# Patient Record
Sex: Female | Born: 1970 | Race: White | Hispanic: No | Smoking: Never smoker
Health system: Southern US, Community
[De-identification: ages and names within clinical notes are randomized; demographics above are authoritative.]

## PROBLEM LIST (undated history)

## (undated) DIAGNOSIS — F32A Depression, unspecified: Secondary | ICD-10-CM

## (undated) DIAGNOSIS — Z923 Personal history of irradiation: Secondary | ICD-10-CM

## (undated) DIAGNOSIS — G43909 Migraine, unspecified, not intractable, without status migrainosus: Secondary | ICD-10-CM

## (undated) DIAGNOSIS — F329 Major depressive disorder, single episode, unspecified: Secondary | ICD-10-CM

## (undated) DIAGNOSIS — C50919 Malignant neoplasm of unspecified site of unspecified female breast: Secondary | ICD-10-CM

## (undated) DIAGNOSIS — F419 Anxiety disorder, unspecified: Secondary | ICD-10-CM

## (undated) DIAGNOSIS — G2581 Restless legs syndrome: Secondary | ICD-10-CM

## (undated) DIAGNOSIS — C801 Malignant (primary) neoplasm, unspecified: Secondary | ICD-10-CM

## (undated) DIAGNOSIS — Z87442 Personal history of urinary calculi: Secondary | ICD-10-CM

## (undated) DIAGNOSIS — K219 Gastro-esophageal reflux disease without esophagitis: Secondary | ICD-10-CM

## (undated) HISTORY — PX: HEMORROIDECTOMY: SUR656

## (undated) HISTORY — PX: CHOLECYSTECTOMY: SHX55

## (undated) HISTORY — PX: ABDOMINAL HYSTERECTOMY: SHX81

## (undated) HISTORY — PX: OTHER SURGICAL HISTORY: SHX169

## (undated) HISTORY — PX: BREAST BIOPSY: SHX20

## (undated) HISTORY — PX: BREAST SURGERY: SHX581

---

## 2012-09-20 DIAGNOSIS — C50919 Malignant neoplasm of unspecified site of unspecified female breast: Secondary | ICD-10-CM

## 2012-09-20 DIAGNOSIS — Z923 Personal history of irradiation: Secondary | ICD-10-CM

## 2012-09-20 HISTORY — PX: BREAST LUMPECTOMY: SHX2

## 2012-09-20 HISTORY — DX: Personal history of irradiation: Z92.3

## 2012-09-20 HISTORY — DX: Malignant neoplasm of unspecified site of unspecified female breast: C50.919

## 2014-11-29 LAB — HM COLONOSCOPY

## 2015-10-28 DIAGNOSIS — Z86000 Personal history of in-situ neoplasm of breast: Secondary | ICD-10-CM | POA: Diagnosis not present

## 2016-09-04 DIAGNOSIS — S61211A Laceration without foreign body of left index finger without damage to nail, initial encounter: Secondary | ICD-10-CM | POA: Diagnosis not present

## 2016-09-09 DIAGNOSIS — Z4801 Encounter for change or removal of surgical wound dressing: Secondary | ICD-10-CM | POA: Diagnosis not present

## 2016-09-09 DIAGNOSIS — Z4802 Encounter for removal of sutures: Secondary | ICD-10-CM | POA: Diagnosis not present

## 2016-10-14 ENCOUNTER — Observation Stay (HOSPITAL_COMMUNITY): Payer: 59

## 2016-10-14 ENCOUNTER — Emergency Department (HOSPITAL_COMMUNITY): Payer: 59

## 2016-10-14 ENCOUNTER — Observation Stay (HOSPITAL_COMMUNITY)
Admission: EM | Admit: 2016-10-14 | Discharge: 2016-10-15 | Disposition: A | Discharge status: Home / Self Care | Payer: 59 | Attending: Internal Medicine | Admitting: Internal Medicine

## 2016-10-14 ENCOUNTER — Encounter (HOSPITAL_COMMUNITY): Payer: Self-pay | Admitting: Emergency Medicine

## 2016-10-14 DIAGNOSIS — F329 Major depressive disorder, single episode, unspecified: Secondary | ICD-10-CM | POA: Diagnosis not present

## 2016-10-14 DIAGNOSIS — G2581 Restless legs syndrome: Secondary | ICD-10-CM | POA: Diagnosis not present

## 2016-10-14 DIAGNOSIS — R42 Dizziness and giddiness: Secondary | ICD-10-CM | POA: Diagnosis not present

## 2016-10-14 DIAGNOSIS — R55 Syncope and collapse: Secondary | ICD-10-CM | POA: Diagnosis not present

## 2016-10-14 DIAGNOSIS — F419 Anxiety disorder, unspecified: Secondary | ICD-10-CM | POA: Insufficient documentation

## 2016-10-14 DIAGNOSIS — R4789 Other speech disturbances: Secondary | ICD-10-CM

## 2016-10-14 DIAGNOSIS — K219 Gastro-esophageal reflux disease without esophagitis: Secondary | ICD-10-CM

## 2016-10-14 DIAGNOSIS — Z853 Personal history of malignant neoplasm of breast: Secondary | ICD-10-CM | POA: Insufficient documentation

## 2016-10-14 DIAGNOSIS — G43909 Migraine, unspecified, not intractable, without status migrainosus: Secondary | ICD-10-CM

## 2016-10-14 DIAGNOSIS — R4701 Aphasia: Secondary | ICD-10-CM | POA: Diagnosis not present

## 2016-10-14 DIAGNOSIS — Z79899 Other long term (current) drug therapy: Secondary | ICD-10-CM | POA: Diagnosis not present

## 2016-10-14 DIAGNOSIS — D0510 Intraductal carcinoma in situ of unspecified breast: Secondary | ICD-10-CM

## 2016-10-14 DIAGNOSIS — D0511 Intraductal carcinoma in situ of right breast: Secondary | ICD-10-CM

## 2016-10-14 DIAGNOSIS — R404 Transient alteration of awareness: Secondary | ICD-10-CM | POA: Diagnosis not present

## 2016-10-14 HISTORY — DX: Malignant (primary) neoplasm, unspecified: C80.1

## 2016-10-14 HISTORY — DX: Migraine, unspecified, not intractable, without status migrainosus: G43.909

## 2016-10-14 HISTORY — DX: Gastro-esophageal reflux disease without esophagitis: K21.9

## 2016-10-14 HISTORY — DX: Depression, unspecified: F32.A

## 2016-10-14 HISTORY — DX: Major depressive disorder, single episode, unspecified: F32.9

## 2016-10-14 HISTORY — DX: Anxiety disorder, unspecified: F41.9

## 2016-10-14 LAB — DIFFERENTIAL
Basophils Absolute: 0 10*3/uL (ref 0.0–0.1)
Basophils Relative: 0 %
Eosinophils Absolute: 0.1 10*3/uL (ref 0.0–0.7)
Eosinophils Relative: 1 %
Lymphocytes Relative: 25 %
Lymphs Abs: 2.1 10*3/uL (ref 0.7–4.0)
Monocytes Absolute: 0.4 10*3/uL (ref 0.1–1.0)
Monocytes Relative: 5 %
Neutro Abs: 5.9 10*3/uL (ref 1.7–7.7)
Neutrophils Relative %: 69 %

## 2016-10-14 LAB — CBC
HCT: 35.8 % — ABNORMAL LOW (ref 36.0–46.0)
Hemoglobin: 12 g/dL (ref 12.0–15.0)
MCH: 30.2 pg (ref 26.0–34.0)
MCHC: 33.5 g/dL (ref 30.0–36.0)
MCV: 89.9 fL (ref 78.0–100.0)
Platelets: 246 10*3/uL (ref 150–400)
RBC: 3.98 MIL/uL (ref 3.87–5.11)
RDW: 13.2 % (ref 11.5–15.5)
WBC: 8.6 10*3/uL (ref 4.0–10.5)

## 2016-10-14 LAB — COMPREHENSIVE METABOLIC PANEL
ALT: 18 U/L (ref 14–54)
AST: 21 U/L (ref 15–41)
Albumin: 3.8 g/dL (ref 3.5–5.0)
Alkaline Phosphatase: 70 U/L (ref 38–126)
Anion gap: 9 (ref 5–15)
BUN: 11 mg/dL (ref 6–20)
CO2: 22 mmol/L (ref 22–32)
Calcium: 9.6 mg/dL (ref 8.9–10.3)
Chloride: 108 mmol/L (ref 101–111)
Creatinine, Ser: 0.73 mg/dL (ref 0.44–1.00)
GFR calc Af Amer: >60 mL/min (ref >60)
GFR calc non Af Amer: >60 mL/min (ref >60)
Glucose, Bld: 100 mg/dL — ABNORMAL HIGH (ref 65–99)
Potassium: 3.8 mmol/L (ref 3.5–5.1)
Sodium: 139 mmol/L (ref 135–145)
Total Bilirubin: 0.3 mg/dL (ref 0.3–1.2)
Total Protein: 7 g/dL (ref 6.5–8.1)

## 2016-10-14 LAB — RAPID URINE DRUG SCREEN, HOSP PERFORMED
AMPHETAMINES: NOT DETECTED
BENZODIAZEPINES: NOT DETECTED
Barbiturates: NOT DETECTED
Cocaine: NOT DETECTED
Opiates: NOT DETECTED
TETRAHYDROCANNABINOL: NOT DETECTED

## 2016-10-14 LAB — URINALYSIS, ROUTINE W REFLEX MICROSCOPIC
Bilirubin Urine: NEGATIVE
GLUCOSE, UA: NEGATIVE mg/dL
HGB URINE DIPSTICK: NEGATIVE
KETONES UR: NEGATIVE mg/dL
Leukocytes, UA: NEGATIVE
Nitrite: NEGATIVE
PROTEIN: NEGATIVE mg/dL
Specific Gravity, Urine: 1.005 (ref 1.005–1.030)
pH: 7 (ref 5.0–8.0)

## 2016-10-14 LAB — I-STAT TROPONIN, ED: Troponin i, poc: 0 ng/mL (ref 0.00–0.08)

## 2016-10-14 LAB — PROTIME-INR
INR: 1.01
Prothrombin Time: 13.3 seconds (ref 11.4–15.2)

## 2016-10-14 LAB — I-STAT CHEM 8, ED
BUN: 12 mg/dL (ref 6–20)
Calcium, Ion: 1.17 mmol/L (ref 1.15–1.40)
Chloride: 107 mmol/L (ref 101–111)
Creatinine, Ser: 1.5 mg/dL — ABNORMAL HIGH (ref 0.44–1.00)
Glucose, Bld: 99 mg/dL (ref 65–99)
HCT: 34 % — ABNORMAL LOW (ref 36.0–46.0)
Hemoglobin: 11.6 g/dL — ABNORMAL LOW (ref 12.0–15.0)
Potassium: 3.7 mmol/L (ref 3.5–5.1)
Sodium: 141 mmol/L (ref 135–145)
TCO2: 24 mmol/L (ref 0–100)

## 2016-10-14 LAB — CBG MONITORING, ED: Glucose-Capillary: 93 mg/dL (ref 65–99)

## 2016-10-14 LAB — APTT: aPTT: 25 seconds (ref 24–36)

## 2016-10-14 MED ORDER — SENNOSIDES-DOCUSATE SODIUM 8.6-50 MG PO TABS
1.0000 | ORAL_TABLET | Freq: Every day | ORAL | Status: DC
  Administered 2016-10-14: 1 via ORAL
  Filled 2016-10-14: qty 1

## 2016-10-14 MED ORDER — HEPARIN SODIUM (PORCINE) 5000 UNIT/ML IJ SOLN
5000.0000 [IU] | Freq: Three times a day (TID) | INTRAMUSCULAR | Status: DC
  Administered 2016-10-14 – 2016-10-15 (×3): 5000 [IU] via SUBCUTANEOUS
  Filled 2016-10-14 (×3): qty 1

## 2016-10-14 MED ORDER — PRAMIPEXOLE DIHYDROCHLORIDE 0.125 MG PO TABS
0.2500 mg | ORAL_TABLET | Freq: Two times a day (BID) | ORAL | Status: DC
  Administered 2016-10-14: 0.25 mg via ORAL
  Filled 2016-10-14: qty 2

## 2016-10-14 MED ORDER — ONDANSETRON HCL 4 MG/2ML IJ SOLN: 4.0000 mg | Freq: Four times a day (QID) | INTRAMUSCULAR | Status: DC | PRN

## 2016-10-14 MED ORDER — GABAPENTIN 300 MG PO CAPS
300.0000 mg | ORAL_CAPSULE | Freq: Two times a day (BID) | ORAL | Status: DC
  Administered 2016-10-14: 300 mg via ORAL
  Filled 2016-10-14 (×2): qty 1

## 2016-10-14 MED ORDER — ACETAMINOPHEN 325 MG PO TABS
650.0000 mg | ORAL_TABLET | Freq: Four times a day (QID) | ORAL | Status: DC | PRN
  Administered 2016-10-14 – 2016-10-15 (×3): 650 mg via ORAL
  Filled 2016-10-14 (×3): qty 2

## 2016-10-14 MED ORDER — PANTOPRAZOLE SODIUM 40 MG PO TBEC
40.0000 mg | DELAYED_RELEASE_TABLET | Freq: Every day | ORAL | Status: DC
Start: 2016-10-14 — End: 2016-10-15
  Filled 2016-10-14: qty 1

## 2016-10-14 MED ORDER — ACETAMINOPHEN 650 MG RE SUPP: 650.0000 mg | Freq: Four times a day (QID) | RECTAL | Status: DC | PRN

## 2016-10-14 MED ORDER — ONDANSETRON HCL 4 MG PO TABS: 4.0000 mg | ORAL_TABLET | Freq: Four times a day (QID) | ORAL | Status: DC | PRN

## 2016-10-14 MED ORDER — ESCITALOPRAM OXALATE 10 MG PO TABS
10.0000 mg | ORAL_TABLET | Freq: Every day | ORAL | Status: DC
  Administered 2016-10-14: 10 mg via ORAL
  Filled 2016-10-14: qty 1

## 2016-10-14 MED ORDER — TOPIRAMATE 100 MG PO TABS
200.0000 mg | ORAL_TABLET | Freq: Every day | ORAL | Status: DC
  Administered 2016-10-14: 200 mg via ORAL
  Filled 2016-10-14: qty 2

## 2016-10-14 NOTE — Progress Notes (Signed)
Requesting Physician: Dr. Wilson Singer    Chief Complaint: Code stroke  History obtained from:  EMS  HPI:                                                                                                                                         Carmen Gutierrez is an 46 y.o. female brought to [facility]'s code stroke. Per EMS patient was at work when she had a syncopal episode. After her episode she came back to baseline. 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 On arrival patient was nonverbal followed no commands however she did track people in their motions in the room. When asked questions all she did is not yes. When nodding yes she would not yes appropriately at times and other times completely inappropriately. When patient was getting undressed she started to tap her hand on the bed and become labile and cry. When asked what was wrong patient would not answer but continued to be very tearful. Patient did not show any localizing or lateralizing symptoms. She was moving all extremities spontaneously antigravity when not looking. When asked about her past medical history or medications she would not answer her diet bulging any information. Initial CT of head was negative.  Date last known well: Date: 10/14/2016 Time last known well: Time: 10:00 tPA Given: No: minimal symptoms   No past medical history on file.  No past surgical history on file.  No family history on file. Social History:  has no tobacco, alcohol, and drug history on file.  Allergies: Allergies not on file  Medications:                                                                                                                           No current facility-administered medications for this encounter.    No current outpatient prescriptions on file.     ROS:  History obtained from unobtainable from patient due to Patient is mute    Neurologic Examination:                                                                                                      There were no vitals taken for this visit.  HEENT-  Normocephalic, no lesions, without obvious abnormality.  Normal external eye and conjunctiva.  Normal TM's bilaterally.  Normal auditory canals and external ears. Normal external nose, mucus membranes and septum.  Normal pharynx. Cardiovascular- S1, S2 normal, pulses palpable throughout   Lungs- chest clear, no wheezing, rales, normal symmetric air entry Abdomen- normal findings: bowel sounds normal Extremities- no edema Lymph-no adenopathy palpable Musculoskeletal-no joint tenderness, deformity or swelling Skin-warm and dry, no hyperpigmentation, vitiligo, or suspicious lesions  Neurological Examination Mental Status: She is alert at this time she tracks my movements. Blinks to threat bilaterally. Attempts to make contact by tapping her hand on the bed with her left hand and then becomes very labile. She answers yes by nodding to all questions even if it is inappropriate Cranial Nerves: II:  Visual fields grossly normal,  III,IV, VI: ptosis not present, extra-ocular motions intact bilaterally, pupils equal, round, reactive to light and accommodation V,VII: smile symmetric, facial light touch sensation normal bilaterally VIII: hearing normal bilaterally IX,X: uvula rises symmetrically XI: bilateral shoulder shrug XII: midline tongue extension Motor: Patient is moving all extremities antigravity spontaneously. On bilateral arms are held above her head and let go she avoids allowing her hand to hit her head. When asked to lift her legs she refuses 2. When [place] temp into change or close she sits there as though she is a rag doll and makes no attempt to help the text change her Sensory: Makes no response to significant noxious  stimuli in all extremities Deep Tendon Reflexes: 2+ and symmetric throughout Plantars: Right: downgoing   Left: downgoing Cerebellar: Unable to obtain as patient would not take part in exam Gait: Not tested       Lab Results: Basic Metabolic Panel:  Recent Labs Lab 10/14/16 1112  NA 141  K 3.7  CL 107  GLUCOSE 99  BUN 12  CREATININE 1.50*    Liver Function Tests: No results for input(s): AST, ALT, ALKPHOS, BILITOT, PROT, ALBUMIN in the last 168 hours. No results for input(s): LIPASE, AMYLASE in the last 168 hours. No results for input(s): AMMONIA in the last 168 hours.  CBC:  Recent Labs Lab 10/14/16 1100 10/14/16 1112  WBC 8.6  --   NEUTROABS 5.9  --   HGB 12.0 11.6*  HCT 35.8* 34.0*  MCV 89.9  --   PLT 246  --     Cardiac Enzymes: No results for input(s): CKTOTAL, CKMB, CKMBINDEX, TROPONINI in the last 168 hours.  Lipid Panel: No results for input(s): CHOL, TRIG, HDL, CHOLHDL, VLDL, LDLCALC in the last 168 hours.  CBG: No results for input(s): GLUCAP in the last 168 hours.  Microbiology: No results found for this or any previous visit.  Coagulation Studies: No results for input(s):  LABPROT, INR in the last 72 hours.  Imaging: Ct Head Code Stroke W/o Cm  Result Date: 10/14/2016 CLINICAL DATA:  Code stroke. 46 year old female with severe headache, became unresponsive. Initial encounter. EXAM: CT HEAD WITHOUT CONTRAST TECHNIQUE: Contiguous axial images were obtained from the base of the skull through the vertex without intravenous contrast. COMPARISON:  Head CT without contrast 06/22/2016 and earlier. FINDINGS: Brain: Cerebral volume is normal. No midline shift, ventriculomegaly, mass effect, evidence of mass lesion, intracranial hemorrhage or evidence of cortically based acute infarction. Gray-white matter differentiation is within normal limits throughout the brain. Vascular: No suspicious intracranial vascular hyperdensity. 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 Sinuses/Orbits: Visualized paranasal sinuses and mastoids are stable and well pneumatized. Other: Negative orbit and scalp soft tissues. ASPECTS ([place] Stroke Program Early CT Score) - Ganglionic level infarction (caudate, lentiform nuclei, internal capsule, insula, M1-M3 cortex): 7 - Supraganglionic infarction (M4-M6 cortex): 3 Total score (0-10 with 10 being normal): 10 IMPRESSION: 1. Stable and normal noncontrast CT appearance of the brain. 2. ASPECTS is 10. 3. The above was relayed via text pager to Dr. Milus Banister on 10/14/2016 at 11:20 . Electronically Signed   By: [name] M.D.   On: 10/14/2016 11:20       Assessment and plan discussed with with attending physician and they are in agreement.    Etta Quill PA-C [facility] [phone]  10/14/2016, 11:28 AM   Assessment: 46 y.o. female with no known past medical history at this time. 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 Currently patient is mute but it tends to make communication through tapping the bed and she is labile and crying. There is no localizing or lateralizing symptoms other than her muteness. CT was negative. Given the fact that she had a syncopal episode and we do not know her past medical history he would be wise to obtain MRI brain in addition EEG to rule out any organic issues. This very well could be nonorganic and psychogenic.  Recommend: MRI brain without contrast EEG  Stroke Risk Factors - none

## 2016-10-14 NOTE — Progress Notes (Signed)
EEG Completed; Results Pending  

## 2016-10-14 NOTE — ED Notes (Signed)
Report given to [facility], RN on [facility]

## 2016-10-14 NOTE — Procedures (Signed)
Electroencephalogram (EEG) Report  Date of study: 10/14/16  Requesting clinician: Etta Quill, PA  Reason for study: Evaluate for possible seizure  Brief clinical history: This is a 46 year old woman who reportedly had 2 episodes where she lost consciousness. After the second episode, she has remained nonverbal. EEG is not being performed for further evaluation.  Medications: None reported  Description: This is a routine EEG performed using standard international 10-20 electrode placement. A total of 18 channels are recorded, including one for the EKG. Wakefulness, drowsiness, and stage NI sleep are recorded.   Activating Maneuvers: None  Findings:  The EKG channel demonstrates a regular rhythm with a rate of 90-100 beats per minute.   The background consists of well-formed alpha activity with average frequency of about 9-11 Hz. The best dominant posterior rhythm is 11 Hz. This is symmetric and reacts as expected with eye opening.   There are no focal asymmetries. No epileptiform discharges are present. No seizures are recorded.   Drowsiness is recorded and is normal in appearance. Stage NI sleep appears normal.  Impression:  This is a normal awake and drowsy EEG.  Clinical correlation: This EEG does not show any evidence of seizure or other pathology that would explain the patient's clinical presentation.   Melba Coon, MD [facility]

## 2016-10-14 NOTE — ED Notes (Signed)
Returned from [employer]-- family at bedside.

## 2016-10-14 NOTE — ED Triage Notes (Signed)
Pt to ED via [facility] from work-- with pt initially c/o severe headache with vomiting, pt requested to go to [facility] , enroute pt became aphasic, and would not follow commands, pt then became a code stroke.  On arrival to this ED -- pt was alert, nonverbal, would follow Dr. Brock Bad commands after CT-- [facility] to follow.  Pt was vomiting enroute-- received Zofran 4 mg IV-- 20g in left ac

## 2016-10-14 NOTE — ED Provider Notes (Signed)
[facility] DEPT Provider Note   CSN: [id number] Arrival date & time: 10/14/16  1103   By signing my name below, I, Carmen Gutierrez, attest that this documentation has been prepared under the direction and in the presence of [name], MD . Electronically Signed: [name], Scribe. 10/14/2016. 11:58 AM.  History   Chief Complaint Chief Complaint  Patient presents with  . Code Stroke    LEVEL 5 CAVEAT DUE TO AMS  History provided by: ED RN. No language interpreter was used.     HPI Comments:  Carmen Gutierrez is a 46 y.o. female who presents to the Emergency Department via EMS. Per Triage note, pt was originally on her way to [facility] with complaint of HA. En route she became aphasic and was instead brought to [facility] as a code stroke. At this time pt non-verbal and history/ROS is unattainable due to her current mental status.    No past medical history on file.  There are no active problems to display for this patient.   No past surgical history on file.  OB History    No data available       Home Medications    Prior to Admission medications   Not on File    Family History No family history on file.  Social History Social History  Substance Use Topics  . Smoking status: Not on file  . Smokeless tobacco: Not on file  . Alcohol use Not on file     Allergies   Patient has no allergy information on record.   Review of Systems Review of Systems  Unable to perform ROS: Mental status change     Physical Exam Updated Vital Signs BP 124/90 (BP Location: Left Arm)   Pulse 82   Temp 98.6 F (37 C) (Oral)   Resp 14   Wt 225 lb 8.5 oz (102.3 kg)   SpO2 100%   Physical Exam  Constitutional: She appears well-developed and well-nourished.  HENT:  Head: Normocephalic and atraumatic.  Eyes: EOM are normal.  Neck: Normal range of motion.  Cardiovascular: Normal rate, regular rhythm and normal heart sounds.   Pulmonary/Chest:  Effort normal and breath sounds normal.  Abdominal: Soft. She exhibits no distension. There is no tenderness.  Musculoskeletal: Normal range of motion.  Neurological: She is alert.  Laying in bed with eyes open; tearful  She'll nod head to questioning but not following commands  No facial droop  Muscle tone is nml  Moved LUE spontaneously   Skin: Skin is warm and dry.  Nursing note and vitals reviewed.    ED Treatments / Results  DIAGNOSTIC STUDIES:  Oxygen Saturation is 99% on RA, normal by my interpretation.    Labs (all labs ordered are listed, but only abnormal results are displayed) Labs Reviewed  CBC - Abnormal; Notable for the following:       Result Value   HCT 35.8 (*)    All other components within normal limits  COMPREHENSIVE METABOLIC PANEL - Abnormal; Notable for the following:    Glucose, Bld 100 (*)    All other components within normal limits  I-STAT CHEM 8, ED - Abnormal; Notable for the following:    Creatinine, Ser 1.50 (*)    Hemoglobin 11.6 (*)    HCT 34.0 (*)    All other components within normal limits  PROTIME-INR  APTT  DIFFERENTIAL  I-STAT TROPOININ, ED  CBG MONITORING, ED    EKG  EKG Interpretation  Date/Time:  Thursday October 14 2016 11:24:09 EST Ventricular Rate:  88 PR Interval:    QRS Duration: 86 QT Interval:  366 QTC Calculation: 443 R Axis:   33 Text Interpretation:  Sinus rhythm No old tracing to compare Confirmed by [name]  MD, [name] [phone]) on 10/14/2016 11:58:14 AM       Radiology Ct Head Code Stroke W/o Cm  Result Date: 10/14/2016 CLINICAL DATA:  Code stroke. 46 year old female with severe headache, became unresponsive. Initial encounter. EXAM: CT HEAD WITHOUT CONTRAST TECHNIQUE: Contiguous axial images were obtained from the base of the skull through the vertex without intravenous contrast. COMPARISON:  Head CT without contrast 06/22/2016 and earlier. FINDINGS: Brain: Cerebral volume is normal. No midline shift,  ventriculomegaly, mass effect, evidence of mass lesion, intracranial hemorrhage or evidence of cortically based acute infarction. Gray-white matter differentiation is within normal limits throughout the brain. Vascular: No suspicious intracranial vascular hyperdensity. 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 Sinuses/Orbits: Visualized paranasal sinuses and mastoids are stable and well pneumatized. Other: Negative orbit and scalp soft tissues. ASPECTS ([place] Stroke Program Early CT Score) - Ganglionic level infarction (caudate, lentiform nuclei, internal capsule, insula, M1-M3 cortex): 7 - Supraganglionic infarction (M4-M6 cortex): 3 Total score (0-10 with 10 being normal): 10 IMPRESSION: 1. Stable and normal noncontrast CT appearance of the brain. 2. ASPECTS is 10. 3. The above was relayed via text pager to Dr. Milus Banister on 10/14/2016 at 11:20 . Electronically Signed   By: [name] M.D.   On: 10/14/2016 11:20    Procedures Procedures (including critical care time)  Medications Ordered in ED Medications - No data to display   Initial Impression / Assessment and Plan / ED Course  I have reviewed the triage vital signs and the nursing notes.  Pertinent labs & imaging results that were available during my care of the patient were reviewed by me and considered in my medical decision making (see chart for details).     46 year old female with a aphasia. She was made a CODE STROKE. She was evaluated by the neurology team. CT the head without any acute abnormality. On my exam, she is nonverbal. She is tearful and seems somewhat frustrated. She'll not follow commands but she will move her extremities.   She apparently was complaining of a headache earlier. She is normotensive. Past medical history list migraines. Her neuro exam has not significantly changed since beingin  the emergency room. Suspect there may be a psychogenic component. Neurology  recommending MRI and EEG. Will discuss with medicine for admission.  Final Clinical Impressions(s) / ED Diagnoses   Final diagnoses:  Aphasia    New Prescriptions New Prescriptions   No medications on file    I personally preformed the services scribed in my presence. The recorded information has been reviewed is accurate. [name], MD.     [name], MD 10/27/16 1225

## 2016-10-14 NOTE — H&P (Signed)
Date: 10/14/2016               Patient Name:  Carmen Gutierrez MRN: [id number]  DOB: 07-Apr-1971 Age / Sex: 46 y.o., female   PCP: No primary care provider on file.         Medical Service: Internal Medicine Teaching Service         Attending Physician: Dr. Sid Falcon, MD    First Contact: Dr. Danford Bad Pager: [phone]  Second Contact: Dr. Tiburcio Pea Pager: [phone]       After Hours (After 5p/  First Contact Pager: [phone]  weekends / holidays): Second Contact Pager: [phone]   Chief Complaint: syncope, aphasia  History of Present Illness:   This is a 46 year old female with Mhx significant for migraines, depression, anxiety, RLS, hx of breast cancer in remission who presents for evaluation of a syncopal episode. 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 She stood up to start walking to the bathroom and felt faint. Unsure of if she lost consciousness but she knew she hit her head during the fall. Coworkers called EMS and per EMS she became aphasic during the ambulance ride. During her time in the ED she remained nonverbal and followed no commands. At the time of my evaluation, the patient had returned to her usual state of health. She reports she's had episodes like this at least 10 times over the past few years. Her sister-in-law reports after these episodes she is usually very disoriented and does not speak until she regains orientation. Denies any prodromal symptoms before the episodes other than feeling dizzy and reports they can occur even when she's seated. She denies any recent illness, headache, dizziness, numbness, tingling, weakness, confusion, fevers, chills, dysuria, abdominal pain, or any other symptoms.  In the ED VSS (98.6*, pulse 82, BP 124/90, respirations 14 and was saturating 100% on RA). CMET wnl however f/u BMET 10 mins later shows Cr of 1.5. CBC wnl. STAT CT head negative. She was  seen by neurology who suggested MRI brain + EEG and who suggested this could be nonorganic and psychogenic.   Meds:  Topamax 200mg  QHS Mirapex 0.25 mg BID Nexium 40 mg QAM Lexapro 10 mg QHS Gabapentin 300 mg BID  Allergies: Allergies as of 10/14/2016 - Review Complete 10/14/2016  Allergen Reaction Noted  . Contrast media [iodinated diagnostic agents] Other (See Comments) 10/14/2016  . Dilaudid [hydromorphone hcl] Hives 10/14/2016  . Phenergan [promethazine hcl] Hives 10/14/2016  . Shellfish allergy  10/14/2016   Past Medical History:  Diagnosis Date  . Anxiety   . Cancer ([place])   . Depression   . GERD (gastroesophageal reflux disease)   . Migraines    Social History: Has never smoked. Rarely drinks alcohol and denied any recreational drug use. Appears to have a supportive husband and sister in law who is a Marine scientist.   Review of Systems: A complete ROS was negative except as per HPI.   Physical Exam: Blood pressure 124/90, pulse 82, temperature 98.6 F (37 C), temperature source Oral, resp. rate 14, weight 225 lb 8.5 oz (102.3 kg), SpO2 100 %. General: In no acute distress. Resting comfortably in bed. Family present at bedside.  HENT: PERRL. EOMI. No conjunctival injection, icterus or ptosis. Oropharynx clear, mucous membranes moist.  Cardiovascular: Regular rate and rhythm. No murmur or rub appreciated. Pulmonary: CTA BL, no wheezing, crackles or rhonchi  appreciated. Unlabored breathing.  Abdomen: Soft, non-tender and non-distended. No guarding or rigidity. +bowel sounds.  Extremities: No peripheral edema noted BL. Intact distal pulses. No gross deformities. Skin: Warm, dry. No cyanosis.  Neuro: Strength 5/5 throughout. Sensation intact and symmetric throughout. CNII-XII grossly intact. Normal finger-to-nose testing.  Psych: Stoic. Mood normal and affect was mood congruent. Responds to questions appropriately.   EKG: NSR with rate of 88. No QT prolongation or PR prolongation.  No prior EKG for comparison however no TWI.  CT Head without Contrast 10/14/16: Normal noncontrast CT.  MRI brain: Normal  EEG 10/14/16: Normal  UDS: Negative UA: Without infection  CMP Latest Ref Rng & Units 10/14/2016 10/14/2016  Glucose 65 - 99 mg/dL 99 100(H)  BUN 6 - 20 mg/dL 12 11  Creatinine 0.44 - 1.00 mg/dL 1.50(H) 0.73  Sodium 135 - 145 mmol/L 141 139  Potassium 3.5 - 5.1 mmol/L 3.7 3.8  Chloride 101 - 111 mmol/L 107 108  CO2 22 - 32 mmol/L - 22  Calcium 8.9 - 10.3 mg/dL - 9.6  Total Protein 6.5 - 8.1 g/dL - 7.0  Total Bilirubin 0.3 - 1.2 mg/dL - 0.3  Alkaline Phos 38 - 126 U/L - 70  AST 15 - 41 U/L - 21  ALT 14 - 54 U/L - 18   CBC    Component Value Date/Time   WBC 8.6 10/14/2016 1100   RBC 3.98 10/14/2016 1100   HGB 11.6 (L) 10/14/2016 1112   HCT 34.0 (L) 10/14/2016 1112   PLT 246 10/14/2016 1100   MCV 89.9 10/14/2016 1100   MCH 30.2 10/14/2016 1100   MCHC 33.5 10/14/2016 1100   RDW 13.2 10/14/2016 1100   LYMPHSABS 2.1 10/14/2016 1100   MONOABS 0.4 10/14/2016 1100   EOSABS 0.1 10/14/2016 1100   BASOSABS 0.0 10/14/2016 1100   Assessment & Plan by Problem: Principal Problem:   Syncope Active Problems:   Aphasia   Migraines   GERD (gastroesophageal reflux disease)   Restless leg syndrome  Syncopal Episode: Pt endorses a several year history of intermittent syncopal episodes. No clear trigger per patient however reports each time her vision gets blurry and she feels lightheaded. Sister in law reports she is quite confused afterwards and often does not speak until reoriented. Denies any seizure like activity or history of seizure disorders. Neurology on and we greatly appreciate their assistance in management of this patient.  -CT head, MRI brain, EEG all normal -UDS negative -Orthostats negative -EKG without suspicious changes -Repeat EKG in AM -HbA1c and Lipid panel -Seizure disorder? Pt seems post-ictal after these events -Psychogenic syncope? -Could  consider additional w/u with Carotid US, Echo however syx have resolved entirely and imaging without ischemia   Migraines: Has been approximately 2 months since last migraine. Migraines not similar to current presentation.  -Continued Topamax 200mg  QPM  Restless Leg Syndrome: Continued Mirapex 0.25 mg BID and Gabapentin 300 mg BID  Depression, Anxiety: Continued Lexapro 10 mg  Diet: Regular IVF: nsl Code: FULL DVT Prophylaxis: Heparin Consults: Neurology  Dispo: Admit patient to Observation with expected length of stay less than 2 midnights.  SignedEinar Gip, DO 10/14/2016, 2:24 PM  Pager: [phone]

## 2016-10-14 NOTE — ED Notes (Signed)
Pt to EEG via stretcher-- remains nonverbal.

## 2016-10-14 NOTE — ED Notes (Signed)
Pt's CBG result was 93. Informed Santiago Glad - RN.

## 2016-10-15 ENCOUNTER — Observation Stay (HOSPITAL_BASED_OUTPATIENT_CLINIC_OR_DEPARTMENT_OTHER): Payer: 59

## 2016-10-15 ENCOUNTER — Encounter (HOSPITAL_COMMUNITY): Payer: Self-pay | Admitting: *Deleted

## 2016-10-15 DIAGNOSIS — Z79899 Other long term (current) drug therapy: Secondary | ICD-10-CM | POA: Diagnosis not present

## 2016-10-15 DIAGNOSIS — G2581 Restless legs syndrome: Secondary | ICD-10-CM | POA: Diagnosis not present

## 2016-10-15 DIAGNOSIS — Z91013 Allergy to seafood: Secondary | ICD-10-CM

## 2016-10-15 DIAGNOSIS — Z91041 Radiographic dye allergy status: Secondary | ICD-10-CM

## 2016-10-15 DIAGNOSIS — Z888 Allergy status to other drugs, medicaments and biological substances status: Secondary | ICD-10-CM | POA: Diagnosis not present

## 2016-10-15 DIAGNOSIS — Z853 Personal history of malignant neoplasm of breast: Secondary | ICD-10-CM | POA: Diagnosis not present

## 2016-10-15 DIAGNOSIS — R55 Syncope and collapse: Secondary | ICD-10-CM

## 2016-10-15 DIAGNOSIS — F419 Anxiety disorder, unspecified: Secondary | ICD-10-CM | POA: Diagnosis not present

## 2016-10-15 DIAGNOSIS — F329 Major depressive disorder, single episode, unspecified: Secondary | ICD-10-CM | POA: Diagnosis not present

## 2016-10-15 DIAGNOSIS — K219 Gastro-esophageal reflux disease without esophagitis: Secondary | ICD-10-CM | POA: Diagnosis not present

## 2016-10-15 DIAGNOSIS — Z885 Allergy status to narcotic agent status: Secondary | ICD-10-CM | POA: Diagnosis not present

## 2016-10-15 DIAGNOSIS — R4701 Aphasia: Secondary | ICD-10-CM | POA: Diagnosis not present

## 2016-10-15 DIAGNOSIS — G43909 Migraine, unspecified, not intractable, without status migrainosus: Secondary | ICD-10-CM | POA: Diagnosis not present

## 2016-10-15 LAB — COMPREHENSIVE METABOLIC PANEL
ALT: 17 U/L (ref 14–54)
ANION GAP: 7 (ref 5–15)
AST: 21 U/L (ref 15–41)
Albumin: 3.4 g/dL — ABNORMAL LOW (ref 3.5–5.0)
Alkaline Phosphatase: 65 U/L (ref 38–126)
BUN: 10 mg/dL (ref 6–20)
CHLORIDE: 108 mmol/L (ref 101–111)
CO2: 23 mmol/L (ref 22–32)
Calcium: 9 mg/dL (ref 8.9–10.3)
Creatinine, Ser: 0.79 mg/dL (ref 0.44–1.00)
GFR calc non Af Amer: >60 mL/min (ref >60)
Glucose, Bld: 97 mg/dL (ref 65–99)
POTASSIUM: 3.7 mmol/L (ref 3.5–5.1)
SODIUM: 138 mmol/L (ref 135–145)
Total Bilirubin: 0.6 mg/dL (ref 0.3–1.2)
Total Protein: 6.3 g/dL — ABNORMAL LOW (ref 6.5–8.1)

## 2016-10-15 LAB — CBC
HCT: 33.3 % — ABNORMAL LOW (ref 36.0–46.0)
HEMOGLOBIN: 11.1 g/dL — AB (ref 12.0–15.0)
MCH: 30.2 pg (ref 26.0–34.0)
MCHC: 33.3 g/dL (ref 30.0–36.0)
MCV: 90.5 fL (ref 78.0–100.0)
Platelets: 234 10*3/uL (ref 150–400)
RBC: 3.68 MIL/uL — AB (ref 3.87–5.11)
RDW: 13.1 % (ref 11.5–15.5)
WBC: 5.9 10*3/uL (ref 4.0–10.5)

## 2016-10-15 LAB — ECHOCARDIOGRAM COMPLETE
HEIGHTINCHES: 64 in
Weight: 3436.8 oz

## 2016-10-15 LAB — GLUCOSE, CAPILLARY: GLUCOSE-CAPILLARY: 95 mg/dL (ref 65–99)

## 2016-10-15 LAB — LIPID PANEL
CHOL/HDL RATIO: 3.5 ratio
Cholesterol: 158 mg/dL (ref 0–200)
HDL: 45 mg/dL (ref >40)
LDL CALC: 93 mg/dL (ref 0–99)
TRIGLYCERIDES: 98 mg/dL (ref <150)
VLDL: 20 mg/dL (ref 0–40)

## 2016-10-15 LAB — VITAMIN B12: Vitamin B-12: 548 pg/mL (ref 180–914)

## 2016-10-15 MED ORDER — KETOROLAC TROMETHAMINE 30 MG/ML IJ SOLN
30.0000 mg | Freq: Once | INTRAMUSCULAR | Status: AC
  Administered 2016-10-15: 30 mg via INTRAVENOUS
  Filled 2016-10-15: qty 1

## 2016-10-15 NOTE — Discharge Instructions (Signed)
You were hospitalized for evaluation of syncope. Your work-up did not reveal any cardiac, neurological or metabolic cause for your syncope. Unfortunately a clear cause is determined in only 50% of syncope cases. Please resume activity slowly and when you start to feel lightheaded or dizzy, please sit or remain seated for 3-5 minutes until it passes. Please follow-up with your primary care physician.  CT head: Normal MRI brain: Normal ECHO: Normal CXR: Without acute process CBC and CMET: within normal limits EKG: No evidence for ischemia EEG: No seizure like activity

## 2016-10-15 NOTE — Progress Notes (Signed)
   Subjective: Carmen Gutierrez was seen and evaluated today at beside. No further syncopal episodes since admission. Complains of headache improved with tylenol and toradol.   Objective:  Vital signs in last 24 hours: Vitals:   10/15/16 0539 10/15/16 0541 10/15/16 0543 10/15/16 1034  BP: 111/70 112/67 112/71 104/61  Pulse: 63 70 (!) 58 60  Resp:    19  Temp:    97.9 F (36.6 C)  TempSrc:    Oral  SpO2:    100%  Weight:      Height:       General: Resting in bed sleeping. In no acute distress.  HENT: PERRL. EOMI. No conjunctival injection, icterus or ptosis.  Cardiovascular: Regular rate and rhythm. No murmur or rub appreciated. Pulmonary: CTA BL, no wheezing, crackles or rhonchi appreciated. Unlabored breathing.  Abdomen: Soft, non-tender and non-distended. No guarding or rigidity. +bowel sounds.  Extremities: No peripheral edema noted BL. Intact distal pulses.  Skin: Warm, dry. No cyanosis.  Neuro: Strength and sensation grossly intact.  Psych: Flat affect. Responds to questions appropriately.   Assessment/Plan:  Principal Problem:   Syncope Active Problems:   Aphasia   Migraines   GERD (gastroesophageal reflux disease)   Restless leg syndrome  Syncope: So far workup (CT head, MRI brain, EEG, ECHO, EKG, trops, CMET, CBC, UA) without cause of patients recurrent syncopal episodes. Events sound consistent with vasovagal syncope.  -Neurology signed off -Likely vasovagal - will be discharged home with instructions for lifestyle modifications.   Migraines: HA today however not migrainous. Appears to be rebound headache from at home PRN analgesics.  -Tylenol -Toradol -Topamax  Dispo: Anticipated discharge today.   Mika Anastasi, DO 10/15/2016, 1:55 PM Pager: [phone]

## 2016-10-15 NOTE — Care Management Note (Signed)
Case Management Note  Patient Details  Name: Carmen Gutierrez MRN: [id number] Date of Birth: October 12, 1970  Subjective/Objective:                    Action/Plan: Pt discharging home with self care. Pt with no PCP listed. Pt states she sees Dr. Joaquin Bend in [place], but she is interested in changing to a [place] MD d/t convenience with her job. CM provided her the [facility] number to assist her in finding a new PCP. Pt state she has transportation home.   Expected Discharge Date:  10/15/16               Expected Discharge Plan:  Home/Self Care  In-House Referral:     Discharge planning Services  CM Consult  Post Acute Care Choice:    Choice offered to:     DME Arranged:    DME Agency:     HH Arranged:    HH Agency:     Status of Service:  Completed, signed off  If discussed at [employer] of Stay Meetings, dates discussed:    Additional Comments:  Pollie Friar, RN 10/15/2016, 1:25 PM

## 2016-10-15 NOTE — Progress Notes (Signed)
  Echocardiogram 2D Echocardiogram has been performed.  Asa Baudoin L Androw 10/15/2016, 10:38 AM

## 2016-10-15 NOTE — Progress Notes (Signed)
Patient is discharged from room 5C08 at 1500. Alert and in stable condition. IV site d/c'd as well as tele. Instructions read to patient with understanding verbalized. Left unit via wheelchair with mom and all belongings at side.

## 2016-10-15 NOTE — Discharge Summary (Signed)
Patient discharged to home. Alert and oriented with no new concerns. Discharge instruction and education provided, patient verbalize understanding. IV D/C earlier prior to discharge. writer advised Patient to leave floor in Wheelchair but patient insist she is doing fine and is able to walk accompanied by a family member.

## 2016-10-15 NOTE — Discharge Summary (Signed)
Name: Carmen Gutierrez MRN: [id number] DOB: 1971-07-18 46 y.o. PCP: No primary care provider on file.  Date of Admission: 10/14/2016 11:10 AM Date of Discharge: 10/15/2016 Attending Physician: Sid Falcon, MD  Discharge Diagnosis: 1. Syncope  Principal Problem:   Syncope Active Problems:   Aphasia   Migraines   GERD (gastroesophageal reflux disease)   Restless leg syndrome   Discharge Medications: Allergies as of 10/15/2016      Reactions   Contrast Media [iodinated Diagnostic Agents] Other (See Comments)   Chest pain   Dilaudid [hydromorphone Hcl] Hives   Phenergan [promethazine Hcl] Hives   Shellfish Allergy       Medication List    TAKE these medications   escitalopram 10 MG tablet Commonly known as:  LEXAPRO Take 10 mg by mouth daily.   gabapentin 300 MG capsule Commonly known as:  NEURONTIN Take 300 mg by mouth 2 (two) times daily.   NEXIUM 40 MG packet Generic drug:  esomeprazole Take 40 mg by mouth daily before breakfast.   pramipexole 0.25 MG tablet Commonly known as:  MIRAPEX Take 0.25 mg by mouth 2 (two) times daily.   rizatriptan 10 MG tablet Commonly known as:  MAXALT Take 10 mg by mouth as needed for migraine. May repeat in 2 hours if needed   topiramate 200 MG tablet Commonly known as:  TOPAMAX Take 200 mg by mouth daily.      Disposition and follow-up:   CarmenBerdene Barrett Evette Gutierrez was discharged from [facility] in stable condition.  At the hospital follow up visit please address:  1.  Syncope: Any more syncopal events? Has she been using the lifestyle modifications discussed? Increased fluid and salt intake?  2.  Labs / imaging needed at time of follow-up: None. Could consider tilt-table test if persistent.   3.  Pending labs/ test needing follow-up: None.  Hospital Course by problem list: Principal Problem:   Syncope Active Problems:   Aphasia   Migraines   GERD (gastroesophageal reflux  disease)   Restless leg syndrome   1. Syncope Carmen Gutierrez is a 46yo woman with PMH of migraines, depression, anxiety and RLS and h/o breast cancer who presented with a syncopal episode.  While working, she stood up and felt lightheaded. She walked to the bathroom and fainted and fell. This was witnessed and no seizure like activity or bowel/bladder incontinence was noted. She then became aphasic and disoriented during ambulance ride. This slowly improved after reaching the hospital and had returned to baseline approximately 3 hours later. She has episodes like this about 10X in the past few years, each time followed by being very confused and not speaking. Neurology was consulted who ordered EEG and MRI brain which were normal. They did not feel like there was a neurologic etiology and had signed off. Cardiac workup, including telemetry, troponins, EKG and ECHO, were normal. Orthostatic vitals were normal as well. She was subsequently discharged home with diagnosis of vasovagal syncope and instructions for lifestyle modifications.   Discharge Vitals:   BP 104/61 (BP Location: Left Arm)   Pulse 60   Temp 97.9 F (36.6 C) (Oral)   Resp 19   Ht 5\' 4"  (1.626 m)   Wt 214 lb 12.8 oz (97.4 kg)   SpO2 100%   BMI 36.87 kg/m   Pertinent Labs, Studies, and Procedures:  MRI  Brain: normal EEG: normal ECHO: normal Troponins: Normal EKG: Without changes consistent with ACS CMET and CBC: unrevealing  Discharge  Instructions: Please follow up with your primary care physician after discharge to discuss this admission. Please take all medications as instructed and comply with lifestyle modifications discussed at discharge.   SignedEinar Gip, DO 10/15/2016, 4:07 PM   Pager: [phone]

## 2016-10-15 NOTE — Care Management Note (Signed)
Case Management Note  Patient Details  Name: Carmen Gutierrez MRN: [id number] Date of Birth: 1971-03-02  Subjective/Objective:           Patient presented with syncope and aphasia. Lives at home with spouse. CM will follow for discharge needs pending patient's progress and physician orders.          Action/Plan:   Expected Discharge Date:   (Pending)               Expected Discharge Plan:     In-House Referral:     Discharge planning Services     Post Acute Care Choice:    Choice offered to:     DME Arranged:    DME Agency:     HH Arranged:    HH Agency:     Status of Service:     If discussed at [employer] of Stay Meetings, dates discussed:    Additional Comments:  Rolm Baptise, RN 10/15/2016, 11:17 AM

## 2016-10-16 LAB — HEMOGLOBIN A1C
Hgb A1c MFr Bld: 5.3 % (ref 4.8–5.6)
Mean Plasma Glucose: 105 mg/dL

## 2016-10-18 ENCOUNTER — Other Ambulatory Visit: Payer: Self-pay | Admitting: Oncology

## 2016-10-18 DIAGNOSIS — Z853 Personal history of malignant neoplasm of breast: Secondary | ICD-10-CM

## 2016-10-19 DIAGNOSIS — J111 Influenza due to unidentified influenza virus with other respiratory manifestations: Secondary | ICD-10-CM | POA: Diagnosis not present

## 2016-10-23 DIAGNOSIS — H04123 Dry eye syndrome of bilateral lacrimal glands: Secondary | ICD-10-CM | POA: Diagnosis not present

## 2016-10-23 DIAGNOSIS — H5213 Myopia, bilateral: Secondary | ICD-10-CM | POA: Diagnosis not present

## 2016-10-23 DIAGNOSIS — H524 Presbyopia: Secondary | ICD-10-CM | POA: Diagnosis not present

## 2016-10-23 DIAGNOSIS — H52223 Regular astigmatism, bilateral: Secondary | ICD-10-CM | POA: Diagnosis not present

## 2016-10-27 ENCOUNTER — Ambulatory Visit
Admission: RE | Admit: 2016-10-27 | Discharge: 2016-10-27 | Disposition: A | Discharge status: Home / Self Care | Payer: 59 | Source: Ambulatory Visit | Attending: Oncology | Admitting: Oncology

## 2016-10-27 DIAGNOSIS — Z853 Personal history of malignant neoplasm of breast: Secondary | ICD-10-CM

## 2016-10-27 DIAGNOSIS — R922 Inconclusive mammogram: Secondary | ICD-10-CM | POA: Diagnosis not present

## 2016-10-27 HISTORY — DX: Malignant neoplasm of unspecified site of unspecified female breast: C50.919

## 2016-10-27 HISTORY — DX: Personal history of irradiation: Z92.3

## 2016-12-01 DIAGNOSIS — K257 Chronic gastric ulcer without hemorrhage or perforation: Secondary | ICD-10-CM | POA: Diagnosis not present

## 2016-12-01 DIAGNOSIS — E8941 Symptomatic postprocedural ovarian failure: Secondary | ICD-10-CM | POA: Diagnosis not present

## 2016-12-01 DIAGNOSIS — K582 Mixed irritable bowel syndrome: Secondary | ICD-10-CM | POA: Diagnosis not present

## 2016-12-01 DIAGNOSIS — G43909 Migraine, unspecified, not intractable, without status migrainosus: Secondary | ICD-10-CM | POA: Diagnosis not present

## 2016-12-01 DIAGNOSIS — Z7689 Persons encountering health services in other specified circumstances: Secondary | ICD-10-CM | POA: Diagnosis not present

## 2016-12-01 DIAGNOSIS — Z91013 Allergy to seafood: Secondary | ICD-10-CM | POA: Diagnosis not present

## 2016-12-01 DIAGNOSIS — K219 Gastro-esophageal reflux disease without esophagitis: Secondary | ICD-10-CM | POA: Diagnosis not present

## 2016-12-01 DIAGNOSIS — G2581 Restless legs syndrome: Secondary | ICD-10-CM | POA: Diagnosis not present

## 2016-12-31 ENCOUNTER — Encounter (HOSPITAL_COMMUNITY): Payer: Self-pay | Admitting: Emergency Medicine

## 2016-12-31 ENCOUNTER — Emergency Department (HOSPITAL_COMMUNITY): Payer: No Typology Code available for payment source

## 2016-12-31 ENCOUNTER — Emergency Department (HOSPITAL_COMMUNITY)
Admission: EM | Admit: 2016-12-31 | Discharge: 2016-12-31 | Disposition: A | Discharge status: Home / Self Care | Payer: No Typology Code available for payment source | Attending: Emergency Medicine | Admitting: Emergency Medicine

## 2016-12-31 DIAGNOSIS — S39012A Strain of muscle, fascia and tendon of lower back, initial encounter: Secondary | ICD-10-CM | POA: Diagnosis not present

## 2016-12-31 DIAGNOSIS — Y999 Unspecified external cause status: Secondary | ICD-10-CM | POA: Insufficient documentation

## 2016-12-31 DIAGNOSIS — M545 Low back pain: Secondary | ICD-10-CM | POA: Diagnosis not present

## 2016-12-31 DIAGNOSIS — M546 Pain in thoracic spine: Secondary | ICD-10-CM | POA: Diagnosis not present

## 2016-12-31 DIAGNOSIS — Y939 Activity, unspecified: Secondary | ICD-10-CM | POA: Diagnosis not present

## 2016-12-31 DIAGNOSIS — Y92410 Unspecified street and highway as the place of occurrence of the external cause: Secondary | ICD-10-CM | POA: Insufficient documentation

## 2016-12-31 DIAGNOSIS — S3992XA Unspecified injury of lower back, initial encounter: Secondary | ICD-10-CM | POA: Diagnosis present

## 2016-12-31 DIAGNOSIS — Z853 Personal history of malignant neoplasm of breast: Secondary | ICD-10-CM | POA: Insufficient documentation

## 2016-12-31 MED ORDER — IBUPROFEN 800 MG PO TABS: 800.0000 mg | ORAL_TABLET | Freq: Three times a day (TID) | ORAL | 0 refills | Status: DC | PRN

## 2016-12-31 MED ORDER — TRAMADOL HCL 50 MG PO TABS: 50.0000 mg | ORAL_TABLET | Freq: Four times a day (QID) | ORAL | 0 refills | Status: DC | PRN

## 2016-12-31 MED ORDER — CYCLOBENZAPRINE HCL 10 MG PO TABS: 10.0000 mg | ORAL_TABLET | Freq: Every day | ORAL | 0 refills | Status: DC

## 2016-12-31 MED ORDER — ACETAMINOPHEN 325 MG PO TABS
650.0000 mg | ORAL_TABLET | Freq: Once | ORAL | Status: AC
  Administered 2016-12-31: 650 mg via ORAL
  Filled 2016-12-31: qty 2
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
## 2016-12-31 NOTE — Discharge Instructions (Signed)
Return here as needed. Your x-rays were normal. Ice and heat to your back. Follow up with your PCP.

## 2017-01-05 DIAGNOSIS — T148XXA Other injury of unspecified body region, initial encounter: Secondary | ICD-10-CM | POA: Diagnosis not present

## 2017-01-05 DIAGNOSIS — M542 Cervicalgia: Secondary | ICD-10-CM | POA: Diagnosis not present

## 2017-05-11 DIAGNOSIS — Z1329 Encounter for screening for other suspected endocrine disorder: Secondary | ICD-10-CM | POA: Diagnosis not present

## 2017-05-11 DIAGNOSIS — Z1389 Encounter for screening for other disorder: Secondary | ICD-10-CM | POA: Diagnosis not present

## 2017-05-11 DIAGNOSIS — Z131 Encounter for screening for diabetes mellitus: Secondary | ICD-10-CM | POA: Diagnosis not present

## 2017-05-11 DIAGNOSIS — Z13220 Encounter for screening for lipoid disorders: Secondary | ICD-10-CM | POA: Diagnosis not present

## 2017-05-11 DIAGNOSIS — Z0000 Encounter for general adult medical examination without abnormal findings: Secondary | ICD-10-CM | POA: Diagnosis not present

## 2017-05-21 ENCOUNTER — Emergency Department (HOSPITAL_COMMUNITY)
Admission: EM | Admit: 2017-05-21 | Discharge: 2017-05-21 | Disposition: A | Discharge status: Home / Self Care | Payer: 59 | Attending: Emergency Medicine | Admitting: Emergency Medicine

## 2017-05-21 ENCOUNTER — Encounter (HOSPITAL_COMMUNITY): Payer: Self-pay | Admitting: Emergency Medicine

## 2017-05-21 DIAGNOSIS — Z79899 Other long term (current) drug therapy: Secondary | ICD-10-CM | POA: Insufficient documentation

## 2017-05-21 DIAGNOSIS — R55 Syncope and collapse: Secondary | ICD-10-CM | POA: Diagnosis not present

## 2017-05-21 DIAGNOSIS — R404 Transient alteration of awareness: Secondary | ICD-10-CM | POA: Diagnosis not present

## 2017-05-21 HISTORY — DX: Restless legs syndrome: G25.81

## 2017-05-21 LAB — BASIC METABOLIC PANEL
ANION GAP: 8 (ref 5–15)
BUN: 6 mg/dL (ref 6–20)
CALCIUM: 9.2 mg/dL (ref 8.9–10.3)
CHLORIDE: 108 mmol/L (ref 101–111)
CO2: 23 mmol/L (ref 22–32)
Creatinine, Ser: 0.78 mg/dL (ref 0.44–1.00)
GFR calc Af Amer: >60 mL/min (ref >60)
GFR calc non Af Amer: >60 mL/min (ref >60)
Glucose, Bld: 120 mg/dL — ABNORMAL HIGH (ref 65–99)
POTASSIUM: 3.3 mmol/L — AB (ref 3.5–5.1)
Sodium: 139 mmol/L (ref 135–145)

## 2017-05-21 LAB — URINALYSIS, ROUTINE W REFLEX MICROSCOPIC
BILIRUBIN URINE: NEGATIVE
Glucose, UA: NEGATIVE mg/dL
Ketones, ur: NEGATIVE mg/dL
NITRITE: NEGATIVE
Protein, ur: NEGATIVE mg/dL
SPECIFIC GRAVITY, URINE: 1.005 (ref 1.005–1.030)
pH: 7 (ref 5.0–8.0)

## 2017-05-21 LAB — CBC
HCT: 35.6 % — ABNORMAL LOW (ref 36.0–46.0)
Hemoglobin: 11.4 g/dL — ABNORMAL LOW (ref 12.0–15.0)
MCH: 27.4 pg (ref 26.0–34.0)
MCHC: 32 g/dL (ref 30.0–36.0)
MCV: 85.6 fL (ref 78.0–100.0)
PLATELETS: 265 10*3/uL (ref 150–400)
RBC: 4.16 MIL/uL (ref 3.87–5.11)
RDW: 14.6 % (ref 11.5–15.5)
WBC: 7.4 10*3/uL (ref 4.0–10.5)

## 2017-05-21 LAB — I-STAT TROPONIN, ED: Troponin i, poc: 0.01 ng/mL (ref 0.00–0.08)

## 2017-05-21 MED ORDER — SODIUM CHLORIDE 0.9 % IV BOLUS (SEPSIS)
500.0000 mL | Freq: Once | INTRAVENOUS | Status: AC
  Administered 2017-05-21: 500 mL via INTRAVENOUS
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
## 2017-05-21 NOTE — ED Notes (Signed)
Phleb at bedside drawing labs. 

## 2017-05-21 NOTE — ED Provider Notes (Signed)
[facility] DEPT Provider Note   CSN: [id number] Arrival date & time: 05/21/17  1802     History   Chief Complaint Chief Complaint  Patient presents with  . Loss of Consciousness  . Headache    HPI Carmen Gutierrez Carmen Gutierrez is a 46 y.o. female.  HPI  46 year old female history migraines, depression, anxiety, breast cancer, history of syncope see note below presents today stating that she had a syncopal episode. 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 She sat down and continued to feel generally weak. She felt improved after about 15 minutes and got to the bathroom at that time had a syncopal episode. This was witnessed although no one present was the witness. She denies any injury during fall. She feels generally weak with some nausea now. She denies chest pain, fever, chills, vomiting, or diarrhea.  1. Syncope Carmen Gutierrez is a 46yo woman with PMH of migraines, depression, anxiety and RLS and h/o breast cancer who presented with a syncopal episode.  While working, she stood up and felt lightheaded. She walked to the bathroom and fainted and fell. This was witnessed and no seizure like activity or bowel/bladder incontinence was noted. She then became aphasic and disoriented during ambulance ride. This slowly improved after reaching the hospital and had returned to baseline approximately 3 hours later. She has episodes like this about 10X in the past few years, each time followed by being very confused and not speaking. Neurology was consulted who ordered EEG and MRI brain which were normal. They did not feel like there was a neurologic etiology and had signed off. Cardiac workup, including telemetry, troponins, EKG and ECHO, were normal. Orthostatic vitals were normal as well. She was subsequently discharged home with diagnosis of vasovagal syncope and instructions for lifestyle modifications.      Past Medical History:  Diagnosis Date  . Anxiety   . Breast cancer ([place]) 2014   right breast  . Cancer ([place])   . Depression   . GERD (gastroesophageal reflux disease)   . Migraines   . Personal history of radiation therapy 2014   R  . Restless leg syndrome     Patient Active Problem List   Diagnosis Date Noted  . Aphasia 10/14/2016  . Syncope 10/14/2016  . Migraines 10/14/2016  . GERD (gastroesophageal reflux disease) 10/14/2016  . Restless leg syndrome 10/14/2016  . [place]     Past Surgical History:  Procedure Laterality Date  . ABDOMINAL HYSTERECTOMY    . BREAST LUMPECTOMY Right 2014  . BREAST SURGERY    . CHOLECYSTECTOMY    . HEMORROIDECTOMY      OB History    No data available       Home Medications    Prior to Admission medications   Medication Sig Start Date End Date Taking? Authorizing Provider  cyclobenzaprine (FLEXERIL) 10 MG tablet Take 1 tablet (10 mg total) by mouth at bedtime. 12/31/16   Lawyer, Harrell Gave, PA-C  escitalopram (LEXAPRO) 10 MG tablet Take 10 mg by mouth daily.    [provider]  esomeprazole (NEXIUM) 40 MG packet Take 40 mg by mouth daily before breakfast.    [provider]  gabapentin (NEURONTIN) 300 MG capsule Take 300 mg by mouth 2 (two) times daily.    [provider]  ibuprofen (ADVIL,MOTRIN) 800 MG tablet Take 1 tablet (800 mg total) by mouth every 8 (eight) hours as needed. 12/31/16   Lawyer, Harrell Gave, PA-C  pramipexole (MIRAPEX) 0.25 MG tablet Take  0.25 mg by mouth 2 (two) times daily. 09/22/16   [provider]  rizatriptan (MAXALT) 10 MG tablet Take 10 mg by mouth as needed for migraine. May repeat in 2 hours if needed    [provider]  topiramate (TOPAMAX) 200 MG tablet Take 200 mg by mouth daily.    [provider]  traMADol (ULTRAM) 50 MG tablet Take 1 tablet (50 mg total) by mouth every 6 (six) hours as needed for severe pain. 12/31/16   Lawyer, Harrell Gave, PA-C    Family History No family history on file.  Social History Social History    Substance Use Topics  . Smoking status: Never Smoker  . Smokeless tobacco: Never Used  . Alcohol use Yes     Comment: occasional     Allergies   Contrast media [iodinated diagnostic agents]; Dilaudid [hydromorphone hcl]; Phenergan [promethazine hcl]; and Shellfish allergy   Review of Systems Review of Systems  All other systems reviewed and are negative.    Physical Exam Updated Vital Signs Ht 1.626 m (5\' 4" )   Wt 97.5 kg (215 lb)   SpO2 97%   BMI 36.90 kg/m   Physical Exam  Constitutional: She is oriented to person, place, and time. She appears well-developed and well-nourished. No distress.  HENT:  Head: Normocephalic and atraumatic.  Right Ear: External ear normal.  Left Ear: External ear normal.  Nose: Nose normal.  Eyes: Pupils are equal, round, and reactive to light. Conjunctivae and EOM are normal.  Neck: Normal range of motion. Neck supple.  Cardiovascular: Normal rate, regular rhythm, normal heart sounds and intact distal pulses.   Pulmonary/Chest: Effort normal.  Abdominal: Soft. Bowel sounds are normal.  Musculoskeletal: Normal range of motion.  Neurological: She is alert and oriented to person, place, and time. She exhibits normal muscle tone. Coordination normal.  Skin: Skin is warm and dry.  Psychiatric: She has a normal mood and affect. Her behavior is normal. Thought content normal.  Nursing note and vitals reviewed.    ED Treatments / Results  Labs (all labs ordered are listed, but only abnormal results are displayed) Labs Reviewed  BASIC METABOLIC PANEL  CBC  URINALYSIS, ROUTINE W REFLEX MICROSCOPIC  CBG MONITORING, ED    EKG  EKG Interpretation  Date/Time:  Saturday May 21 2017 18:10:33 EDT Ventricular Rate:  82 PR Interval:    QRS Duration: 88 QT Interval:  356 QTC Calculation: 416 R Axis:   33 Text Interpretation:  Sinus rhythm No significant change since last tracing 10/14/16 Confirmed by Pattricia Boss [phone]) on 05/21/2017  6:43:02 PM       Radiology No results found.  Procedures Procedures (including critical care time)  Medications Ordered in ED Medications  sodium chloride 0.9 % bolus 500 mL (not administered)     Initial Impression / Assessment and Plan / ED Course  I have reviewed the triage vital signs and the nursing notes.  Pertinent labs & imaging results that were available during my care of the patient were reviewed by me and considered in my medical decision making (see chart for details).    This is a 46 year old female who presents today with syncope.has had multiple similar episodes in the past and was hospitalized in January of this year. At that time she had G consult with EEG and MRI. Cardiology consult with serial troponins, EKG, and echo. It was felt that she had vasovagal syncope and was instructed regarding loss all modifications. Her presentation today appears very  similar. She appears to be stable for discharge given her previous workup. She was counseled and and will follow-up as outpatient.  Final Clinical Impressions(s) / ED Diagnoses   Final diagnoses:  Vasovagal syncope    New Prescriptions New Prescriptions   No medications on file     Pattricia Boss, MD 05/21/17 2032

## 2017-05-21 NOTE — ED Notes (Signed)
ED Provider at bedside. 

## 2017-06-25 ENCOUNTER — Ambulatory Visit (HOSPITAL_COMMUNITY)
Admission: EM | Admit: 2017-06-25 | Discharge: 2017-06-25 | Disposition: A | Discharge status: Home / Self Care | Payer: 59 | Attending: Family Medicine | Admitting: Family Medicine

## 2017-06-25 ENCOUNTER — Encounter (HOSPITAL_COMMUNITY): Payer: Self-pay

## 2017-06-25 DIAGNOSIS — J069 Acute upper respiratory infection, unspecified: Secondary | ICD-10-CM | POA: Diagnosis not present

## 2017-06-25 DIAGNOSIS — B9789 Other viral agents as the cause of diseases classified elsewhere: Secondary | ICD-10-CM

## 2017-06-25 MED ORDER — PREDNISONE 10 MG (21) PO TBPK: ORAL_TABLET | Freq: Every day | ORAL | 0 refills | Status: DC

## 2017-06-25 MED ORDER — BENZONATATE 100 MG PO CAPS: 100.0000 mg | ORAL_CAPSULE | Freq: Three times a day (TID) | ORAL | 0 refills | Status: DC

## 2017-06-25 NOTE — ED Provider Notes (Signed)
  [facility]   [id number] 06/25/17 Arrival Time: 6659  ASSESSMENT & PLAN:  1. Viral URI with cough     Meds ordered this encounter  Medications  . benzonatate (TESSALON) 100 MG capsule    Sig: Take 1 capsule (100 mg total) by mouth every 8 (eight) hours.    Dispense:  21 capsule    Refill:  0  . predniSONE (STERAPRED UNI-PAK 21 TAB) 10 MG (21) TBPK tablet    Sig: Take by mouth daily. Take as directed.    Dispense:  21 tablet    Refill:  0   OTC symptom care as needed. Fluids/rest. Work note given. May f/u as needed.  Reviewed expectations re: course of current medical issues. Questions answered. Outlined signs and symptoms indicating need for more acute intervention. Patient verbalized understanding. After Visit Summary given.   SUBJECTIVE:  Carmen Gutierrez is a 46 y.o. female who presents with complaint of nasal congestion, post-nasal drainage, and a persistent cough. Onset abrupt approximately 2 days ago. Overall fatigued. SOB: none. Wheezing: questions when she is coughing. Normal PO intake. No n/v. No specific aggravating or alleviating factors reported. Cough is affecting sleep. Non-smoker.  OTC treatment: none.  ROS: As per HPI.   OBJECTIVE:  Vitals:   06/25/17 1232  BP: 115/83  Pulse: 80  Resp: 16  Temp: 98.5 F (36.9 C)  TempSrc: Oral  SpO2: 100%     General appearance: alert; no distress HEENT: nasal congestion; clear runny nose; throat irritation secondary to post-nasal drainage Neck: supple without LAD Lungs: clear to auscultation bilaterally; dry cough; few expiratory wheezes that clear quickly Skin: warm and dry Psychological: alert and cooperative; normal mood and affect  No results found.  Allergies  Allergen Reactions  . Shellfish Allergy Anaphylaxis, Hives and Swelling  . Contrast Media [Iodinated Diagnostic Agents] Other (See Comments)    Chest pain  . Dilaudid [Hydromorphone Hcl] Hives  . Phenergan [Promethazine  Hcl] Hives    Past Medical History:  Diagnosis Date  . Anxiety   . Breast cancer ([place]) 2014   right breast  . Cancer ([place])   . Depression   . GERD (gastroesophageal reflux disease)   . Migraines   . Personal history of radiation therapy 2014   R  . Restless leg syndrome    Social History   Social History  . Marital status: Married    Spouse name: N/A  . Number of children: N/A  . Years of education: N/A   Occupational History  . Not on file.   Social History Main Topics  . Smoking status: Never Smoker  . Smokeless tobacco: Never Used  . Alcohol use Yes     Comment: occasional  . Drug use: No  . Sexual activity: Not on file   Other Topics Concern  . Not on file   Social History Narrative  . No narrative on file            Vanessa Kick, MD 06/25/17 1250

## 2017-06-25 NOTE — ED Triage Notes (Addendum)
Patient presents to [facility] with complaints of non-productive cough along with sore throat, some wheezing, and SOB x2days pt also complains of chest pain possibly due from coughing. Pt has been exposed to pneumonia she states her sister and her niece currently have pneumonia

## 2017-06-27 DIAGNOSIS — J069 Acute upper respiratory infection, unspecified: Secondary | ICD-10-CM | POA: Diagnosis not present

## 2017-06-28 DIAGNOSIS — R05 Cough: Secondary | ICD-10-CM | POA: Diagnosis not present

## 2017-06-28 DIAGNOSIS — J189 Pneumonia, unspecified organism: Secondary | ICD-10-CM | POA: Diagnosis not present

## 2017-06-29 DIAGNOSIS — R0602 Shortness of breath: Secondary | ICD-10-CM | POA: Diagnosis not present

## 2017-06-29 DIAGNOSIS — R05 Cough: Secondary | ICD-10-CM | POA: Diagnosis not present

## 2017-06-29 DIAGNOSIS — R7989 Other specified abnormal findings of blood chemistry: Secondary | ICD-10-CM | POA: Diagnosis not present

## 2017-07-02 DIAGNOSIS — J9690 Respiratory failure, unspecified, unspecified whether with hypoxia or hypercapnia: Secondary | ICD-10-CM | POA: Diagnosis not present

## 2017-07-02 DIAGNOSIS — J189 Pneumonia, unspecified organism: Secondary | ICD-10-CM | POA: Diagnosis not present

## 2017-07-02 DIAGNOSIS — R0789 Other chest pain: Secondary | ICD-10-CM | POA: Diagnosis not present

## 2017-07-03 DIAGNOSIS — G933 Postviral fatigue syndrome: Secondary | ICD-10-CM | POA: Diagnosis not present

## 2017-07-03 DIAGNOSIS — R112 Nausea with vomiting, unspecified: Secondary | ICD-10-CM | POA: Diagnosis not present

## 2017-07-03 DIAGNOSIS — M549 Dorsalgia, unspecified: Secondary | ICD-10-CM | POA: Diagnosis not present

## 2017-07-03 DIAGNOSIS — R070 Pain in throat: Secondary | ICD-10-CM | POA: Diagnosis not present

## 2017-07-03 DIAGNOSIS — R05 Cough: Secondary | ICD-10-CM | POA: Diagnosis not present

## 2017-07-03 DIAGNOSIS — R0602 Shortness of breath: Secondary | ICD-10-CM | POA: Diagnosis not present

## 2017-07-03 DIAGNOSIS — R509 Fever, unspecified: Secondary | ICD-10-CM | POA: Diagnosis not present

## 2017-07-03 DIAGNOSIS — G43901 Migraine, unspecified, not intractable, with status migrainosus: Secondary | ICD-10-CM | POA: Diagnosis not present

## 2017-07-03 DIAGNOSIS — R197 Diarrhea, unspecified: Secondary | ICD-10-CM | POA: Diagnosis not present

## 2017-07-03 DIAGNOSIS — R0600 Dyspnea, unspecified: Secondary | ICD-10-CM | POA: Diagnosis not present

## 2017-07-03 DIAGNOSIS — J069 Acute upper respiratory infection, unspecified: Secondary | ICD-10-CM | POA: Diagnosis not present

## 2017-07-03 DIAGNOSIS — R490 Dysphonia: Secondary | ICD-10-CM | POA: Diagnosis not present

## 2017-07-04 DIAGNOSIS — R490 Dysphonia: Secondary | ICD-10-CM | POA: Diagnosis not present

## 2017-07-04 DIAGNOSIS — J069 Acute upper respiratory infection, unspecified: Secondary | ICD-10-CM | POA: Diagnosis not present

## 2017-07-04 DIAGNOSIS — R0602 Shortness of breath: Secondary | ICD-10-CM | POA: Diagnosis not present

## 2017-07-04 DIAGNOSIS — J181 Lobar pneumonia, unspecified organism: Secondary | ICD-10-CM | POA: Diagnosis not present

## 2017-07-04 DIAGNOSIS — R509 Fever, unspecified: Secondary | ICD-10-CM | POA: Diagnosis not present

## 2017-07-04 DIAGNOSIS — R112 Nausea with vomiting, unspecified: Secondary | ICD-10-CM | POA: Diagnosis not present

## 2017-07-04 DIAGNOSIS — R197 Diarrhea, unspecified: Secondary | ICD-10-CM | POA: Diagnosis not present

## 2017-07-04 DIAGNOSIS — R070 Pain in throat: Secondary | ICD-10-CM | POA: Diagnosis not present

## 2017-07-04 DIAGNOSIS — R05 Cough: Secondary | ICD-10-CM | POA: Diagnosis not present

## 2017-07-04 DIAGNOSIS — G43901 Migraine, unspecified, not intractable, with status migrainosus: Secondary | ICD-10-CM | POA: Diagnosis not present

## 2017-07-07 DIAGNOSIS — R062 Wheezing: Secondary | ICD-10-CM | POA: Diagnosis not present

## 2017-07-07 DIAGNOSIS — J181 Lobar pneumonia, unspecified organism: Secondary | ICD-10-CM | POA: Diagnosis not present

## 2017-08-24 ENCOUNTER — Emergency Department (HOSPITAL_COMMUNITY): Payer: 59

## 2017-08-24 ENCOUNTER — Emergency Department (HOSPITAL_COMMUNITY)
Admission: EM | Admit: 2017-08-24 | Discharge: 2017-08-24 | Disposition: A | Discharge status: Home / Self Care | Payer: 59 | Attending: Emergency Medicine | Admitting: Emergency Medicine

## 2017-08-24 ENCOUNTER — Encounter (HOSPITAL_COMMUNITY): Payer: Self-pay | Admitting: Emergency Medicine

## 2017-08-24 DIAGNOSIS — F329 Major depressive disorder, single episode, unspecified: Secondary | ICD-10-CM | POA: Diagnosis not present

## 2017-08-24 DIAGNOSIS — Z79899 Other long term (current) drug therapy: Secondary | ICD-10-CM | POA: Insufficient documentation

## 2017-08-24 DIAGNOSIS — Z853 Personal history of malignant neoplasm of breast: Secondary | ICD-10-CM | POA: Insufficient documentation

## 2017-08-24 DIAGNOSIS — R404 Transient alteration of awareness: Secondary | ICD-10-CM | POA: Diagnosis not present

## 2017-08-24 DIAGNOSIS — R55 Syncope and collapse: Secondary | ICD-10-CM | POA: Insufficient documentation

## 2017-08-24 DIAGNOSIS — F419 Anxiety disorder, unspecified: Secondary | ICD-10-CM | POA: Diagnosis not present

## 2017-08-24 DIAGNOSIS — Z9049 Acquired absence of other specified parts of digestive tract: Secondary | ICD-10-CM | POA: Diagnosis not present

## 2017-08-24 LAB — I-STAT TROPONIN, ED: Troponin i, poc: 0 ng/mL (ref 0.00–0.08)

## 2017-08-24 MED ORDER — IOPAMIDOL (ISOVUE-370) INJECTION 76%
INTRAVENOUS | Status: AC
  Administered 2017-08-24: 50 mL
  Filled 2017-08-24: qty 50

## 2017-08-24 MED ORDER — DIPHENHYDRAMINE HCL 25 MG PO CAPS: 50.0000 mg | ORAL_CAPSULE | Freq: Once | ORAL | Status: DC

## 2017-08-24 MED ORDER — HYDROCORTISONE NA SUCCINATE PF 100 MG IJ SOLR: 200.0000 mg | Freq: Once | INTRAMUSCULAR | Status: DC

## 2017-08-24 MED ORDER — ACETAMINOPHEN 500 MG PO TABS
1000.0000 mg | ORAL_TABLET | Freq: Once | ORAL | Status: AC
  Administered 2017-08-24: 1000 mg via ORAL
  Filled 2017-08-24: qty 2

## 2017-08-24 MED ORDER — METOCLOPRAMIDE HCL 5 MG/ML IJ SOLN
10.0000 mg | Freq: Once | INTRAMUSCULAR | Status: AC
  Administered 2017-08-24: 10 mg via INTRAVENOUS
  Filled 2017-08-24: qty 2

## 2017-08-24 NOTE — ED Provider Notes (Signed)
Pt care assumed at 1500.  She is here for evaluation of multiple syncopal events associated with headache.  CTA head and neck is pending.  Epic notes that she has a history of iodinated contrast.  On discussion with the patient she denies this and states that her allergy and reaction is to MRI contrast.  CTA obtained and is negative for acute disease process.  Discussed with patient unclear source of her symptoms.  Plan to discharge home with outpatient follow-up.   Quintella Reichert, MD 08/24/17 [phone]

## 2017-08-24 NOTE — ED Triage Notes (Signed)
Pt arrives via EMS from work after having a syncopal episode when walking to her desk. Denies hitting her head. Complaints of nausea, headache, burning sensation in chest. Pt endorses hx of syncopal episodes and breast CA. EMS gave 325 ASA, 4 mg zofran IM.

## 2017-08-24 NOTE — ED Provider Notes (Signed)
[facility] EMERGENCY DEPARTMENT Provider Note   CSN: [id number] Arrival date & time: 08/24/17  1327  History   Chief Complaint Chief Complaint  Patient presents with  . Loss of Consciousness   HPI Carmen Gutierrez Mancel Bale is a 46 y.o. female with a PMHx significant for migraine headaches, anxiety, depression, and recurrent syncopal episodes who arrived via EMS after experiencing a syncopal episode when walking to her desk at work. She states that she was in her usual state of health at work when she went to the bathroom, after she was returning to her desk and subsequently syncopized. She denies any prodromal symptoms but states this is consistent with her prior episodes. She did have visual disturbances immediately after the event which have resolved and currently have a headache and a burning sensation in her chest. She is not feeling well and back to her baseline but is wondering why this continues to happen. She denies palpitations, diaphoresis, recent illness, abdominal pain, focal weakness/numbness, seizure like activity, urinary or bowel incontinence, tongue biting. She denies increased stress at home or work.   She has had several evaluations in the past for these syncopal episodes including an EEG, MRI, and echocardiogram, which have all been normal. Neurology evaluation in the past felt these episodes were not of neurological etiology in the past.   Past Medical History:  Diagnosis Date  . Anxiety   . Breast cancer ([place]) 2014   right breast  . Cancer ([place])   . Depression   . GERD (gastroesophageal reflux disease)   . Migraines   . Personal history of radiation therapy 2014   R  . Restless leg syndrome    Patient Active Problem List   Diagnosis Date Noted  . Aphasia 10/14/2016  . Syncope 10/14/2016  . Migraines 10/14/2016  . GERD (gastroesophageal reflux disease) 10/14/2016  . Restless leg syndrome 10/14/2016  . [place]    Past Surgical History:    Procedure Laterality Date  . ABDOMINAL HYSTERECTOMY    . BREAST LUMPECTOMY Right 2014  . BREAST SURGERY    . CHOLECYSTECTOMY    . HEMORROIDECTOMY     OB History    No data available     Home Medications    Prior to Admission medications   Medication Sig Start Date End Date Taking? Authorizing Provider  Ca Carbonate-Mag Hydroxide (ROLAIDS) 550-110 MG CHEW Chew 1-2 tablets by mouth every 6 (six) hours as needed (for indigestion).   Yes [provider]  escitalopram (LEXAPRO) 20 MG tablet Take 20 mg by mouth at bedtime. 05/11/17  Yes [provider]  esomeprazole (NEXIUM) 40 MG packet Take 40 mg by mouth daily before breakfast.   Yes [provider]  gabapentin (NEURONTIN) 300 MG capsule Take 300 mg by mouth at bedtime.    Yes [provider]  pramipexole (MIRAPEX) 0.25 MG tablet Take 0.25 mg by mouth 2 (two) times daily. 09/22/16  Yes [provider]  rizatriptan (MAXALT) 10 MG tablet Take 10 mg by mouth as needed for migraine. May repeat in 2 hours if needed   Yes [provider]  topiramate (TOPAMAX) 200 MG tablet Take 200 mg by mouth daily.   Yes [provider]  benzonatate (TESSALON) 100 MG capsule Take 1 capsule (100 mg total) by mouth every 8 (eight) hours. Patient not taking: Reported on 08/24/2017 06/25/17   Vanessa Kick, MD  cyclobenzaprine (FLEXERIL) 10 MG tablet Take 1 tablet (10 mg total) by mouth at bedtime.  Patient not taking: Reported on 05/21/2017 12/31/16   Dalia Heading, PA-C  ibuprofen (ADVIL,MOTRIN) 800 MG tablet Take 1 tablet (800 mg total) by mouth every 8 (eight) hours as needed. Patient not taking: Reported on 05/21/2017 12/31/16   Lawyer, Harrell Gave, PA-C  predniSONE (STERAPRED UNI-PAK 21 TAB) 10 MG (21) TBPK tablet Take by mouth daily. Take as directed. 06/25/17   Vanessa Kick, MD  traMADol (ULTRAM) 50 MG tablet Take 1 tablet (50 mg total) by mouth every 6 (six) hours as needed for severe  pain. Patient not taking: Reported on 05/21/2017 12/31/16   Dalia Heading, PA-C   Family History No family history on file.  Social History Social History   Tobacco Use  . Smoking status: Never Smoker  . Smokeless tobacco: Never Used  Substance Use Topics  . Alcohol use: Yes    Comment: occasional  . Drug use: No   Allergies   Shellfish allergy; Contrast media [iodinated diagnostic agents]; Dilaudid [hydromorphone hcl]; and Phenergan [promethazine hcl]  Review of Systems  All systems reviewed and are negative for acute change except as noted in the HPI.  Physical Exam Updated Vital Signs BP 125/77   Pulse 71   Temp 98.3 F (36.8 C) (Oral)   Resp 17   SpO2 100%   General: Obese female in no acute distress HENT: Normocephalic, atraumatic, PERRLA, moist mucus membranes  Pulm: Good air movement with no crackles or wheezing  CV: RRR, no murmurs, no rubs Abdomen: Active bowel sounds, soft, non-distended, no tenderness to palpation  Extremities: No LE edema   Skin: Warm and dry  Neuro: Alert and oriented x 3. Cranial nerves intact bilaterally. Gross motor strength 5/5 in upper and lower extremities bilaterally. Sensation to touch intact in the upper and lower extremities bilaterally.   EKG: Sinus rhythm with normal axis and intervals. T wave inversion in lead V1 but no ST elevation or LBBB. These findings are stable compared to her EKG on 05/21/2017.   ED Treatments / Results  Labs (all labs ordered are listed, but only abnormal results are displayed) Labs Reviewed  I-STAT TROPONIN, ED   EKG  EKG Interpretation  Date/Time:  Wednesday August 24 2017 13:33:18 EST Ventricular Rate:  66 PR Interval:    QRS Duration: 89 QT Interval:  417 QTC Calculation: 437 R Axis:   59 Text Interpretation:  Sinus rhythm Low voltage, precordial leads No significant change since last tracing Confirmed by Deno Etienne [phone]) on 08/24/2017 2:01:43 PM      Radiology No results  found.  Procedures Procedures (including critical care time)  Medications Ordered in ED Medications  metoCLOPramide (REGLAN) injection 10 mg (not administered)  acetaminophen (TYLENOL) tablet 1,000 mg (1,000 mg Oral Given 08/24/17 1420)   Initial Impression / Assessment and Plan / ED Course  I have reviewed the triage vital signs and the nursing notes.  Pertinent labs & imaging results that were available during my care of the patient were reviewed by me and considered in my medical decision making (see chart for details).    Patient presented after a witness syncopal episode at work. This episode is consistent with prior episodes and the patient is completely back to baseline. EKG and troponin negative. There are no focal neurological deficits making acute CVA less likely. I spoke with neurology who recommended outpatient follow-up with neurology and cardiology for extended cardiac monitoring and a tilt table test. They also recommend obtaining a CTA of the head and neck while in the ED  now.   Discussed the plan with the patient. She agree to getting CTA head and neck while in the hospital and follow-up with both cardiology and neurology. Care was handed off to the next EDP and they will follow-up on the CTA prior to discharge home.   Ambulatory referral to both cardiology and neurology have been placed.   Final Clinical Impressions(s) / ED Diagnoses   Final diagnoses:  Recurrent syncope   ED Discharge Orders        Ordered    Ambulatory referral to Neurology    Comments:  An appointment is requested as soon as possible.   08/24/17 1503    Ambulatory referral to Cardiology     08/24/17 1503       Ina Homes, MD 08/24/17 [place], [place], DO 08/25/17 1609

## 2017-11-17 ENCOUNTER — Other Ambulatory Visit: Payer: Self-pay | Admitting: Family

## 2017-11-17 ENCOUNTER — Other Ambulatory Visit: Payer: Self-pay | Admitting: Oncology

## 2017-11-17 DIAGNOSIS — Z9889 Other specified postprocedural states: Secondary | ICD-10-CM

## 2017-11-29 ENCOUNTER — Ambulatory Visit
Admission: RE | Admit: 2017-11-29 | Discharge: 2017-11-29 | Disposition: A | Discharge status: Home / Self Care | Payer: No Typology Code available for payment source | Source: Ambulatory Visit | Attending: Family | Admitting: Family

## 2017-11-29 DIAGNOSIS — Z9889 Other specified postprocedural states: Secondary | ICD-10-CM

## 2018-03-16 ENCOUNTER — Other Ambulatory Visit (HOSPITAL_COMMUNITY): Payer: Self-pay | Admitting: Family

## 2018-03-16 DIAGNOSIS — I739 Peripheral vascular disease, unspecified: Secondary | ICD-10-CM

## 2018-03-21 ENCOUNTER — Ambulatory Visit (HOSPITAL_COMMUNITY)
Admission: RE | Admit: 2018-03-21 | Discharge: 2018-03-21 | Disposition: A | Discharge status: Home / Self Care | Payer: No Typology Code available for payment source | Source: Ambulatory Visit | Attending: Family | Admitting: Family

## 2018-03-21 DIAGNOSIS — I739 Peripheral vascular disease, unspecified: Secondary | ICD-10-CM

## 2018-03-21 DIAGNOSIS — R0989 Other specified symptoms and signs involving the circulatory and respiratory systems: Secondary | ICD-10-CM | POA: Diagnosis present

## 2018-03-21 NOTE — Progress Notes (Signed)
Bilateral ABIs completed. ABIs and Doppler waveforms indicate normal arterial flow at rest. Toma Copier, [place] 03/21/2018 11:28 AM

## 2018-03-27 ENCOUNTER — Other Ambulatory Visit: Payer: Self-pay | Admitting: Urology

## 2018-03-29 ENCOUNTER — Encounter (HOSPITAL_COMMUNITY): Payer: Self-pay | Admitting: General Practice

## 2018-03-30 ENCOUNTER — Encounter (HOSPITAL_COMMUNITY): Payer: Self-pay | Admitting: *Deleted

## 2018-03-30 ENCOUNTER — Ambulatory Visit (HOSPITAL_COMMUNITY): Payer: No Typology Code available for payment source

## 2018-03-30 ENCOUNTER — Other Ambulatory Visit: Payer: Self-pay

## 2018-03-30 ENCOUNTER — Ambulatory Visit (HOSPITAL_COMMUNITY)
Admission: RE | Admit: 2018-03-30 | Discharge: 2018-03-30 | Disposition: A | Discharge status: Home / Self Care | Payer: No Typology Code available for payment source | Source: Ambulatory Visit | Attending: Urology | Admitting: Urology

## 2018-03-30 ENCOUNTER — Encounter (HOSPITAL_COMMUNITY)
Admission: RE | Disposition: A | Discharge status: Home / Self Care | Payer: Self-pay | Source: Ambulatory Visit | Attending: Urology

## 2018-03-30 DIAGNOSIS — Z79899 Other long term (current) drug therapy: Secondary | ICD-10-CM | POA: Diagnosis not present

## 2018-03-30 DIAGNOSIS — Z853 Personal history of malignant neoplasm of breast: Secondary | ICD-10-CM | POA: Insufficient documentation

## 2018-03-30 DIAGNOSIS — N201 Calculus of ureter: Secondary | ICD-10-CM | POA: Insufficient documentation

## 2018-03-30 HISTORY — PX: EXTRACORPOREAL SHOCK WAVE LITHOTRIPSY: SHX1557

## 2018-03-30 HISTORY — DX: Personal history of urinary calculi: Z87.442

## 2018-03-30 SURGERY — LITHOTRIPSY, ESWL
Anesthesia: LOCAL | Laterality: Left

## 2018-03-30 MED ORDER — DIPHENHYDRAMINE HCL 25 MG PO CAPS
25.0000 mg | ORAL_CAPSULE | ORAL | Status: AC
  Administered 2018-03-30: 25 mg via ORAL
  Filled 2018-03-30: qty 1

## 2018-03-30 MED ORDER — CIPROFLOXACIN HCL 500 MG PO TABS
500.0000 mg | ORAL_TABLET | ORAL | Status: AC
  Administered 2018-03-30: 500 mg via ORAL
  Filled 2018-03-30: qty 1

## 2018-03-30 MED ORDER — SODIUM CHLORIDE 0.9 % IV SOLN
INTRAVENOUS | Status: DC
  Administered 2018-03-30: 15:00:00 via INTRAVENOUS

## 2018-03-30 MED ORDER — DIAZEPAM 5 MG PO TABS
10.0000 mg | ORAL_TABLET | ORAL | Status: AC
  Administered 2018-03-30: 10 mg via ORAL
  Filled 2018-03-30: qty 2

## 2018-07-10 ENCOUNTER — Encounter (HOSPITAL_COMMUNITY): Payer: Self-pay | Admitting: Urology

## 2018-09-20 ENCOUNTER — Encounter (HOSPITAL_BASED_OUTPATIENT_CLINIC_OR_DEPARTMENT_OTHER): Payer: Self-pay | Admitting: Emergency Medicine

## 2018-09-20 ENCOUNTER — Emergency Department (HOSPITAL_BASED_OUTPATIENT_CLINIC_OR_DEPARTMENT_OTHER): Payer: No Typology Code available for payment source

## 2018-09-20 ENCOUNTER — Other Ambulatory Visit: Payer: Self-pay

## 2018-09-20 ENCOUNTER — Emergency Department (HOSPITAL_BASED_OUTPATIENT_CLINIC_OR_DEPARTMENT_OTHER)
Admission: EM | Admit: 2018-09-20 | Discharge: 2018-09-20 | Disposition: A | Discharge status: Home / Self Care | Payer: No Typology Code available for payment source | Attending: Emergency Medicine | Admitting: Emergency Medicine

## 2018-09-20 DIAGNOSIS — Z853 Personal history of malignant neoplasm of breast: Secondary | ICD-10-CM | POA: Diagnosis not present

## 2018-09-20 DIAGNOSIS — R109 Unspecified abdominal pain: Secondary | ICD-10-CM | POA: Diagnosis not present

## 2018-09-20 DIAGNOSIS — Z79899 Other long term (current) drug therapy: Secondary | ICD-10-CM | POA: Diagnosis not present

## 2018-09-20 LAB — COMPREHENSIVE METABOLIC PANEL
ALT: 21 U/L (ref 0–44)
AST: 26 U/L (ref 15–41)
Albumin: 4.4 g/dL (ref 3.5–5.0)
Alkaline Phosphatase: 74 U/L (ref 38–126)
Anion gap: 7 (ref 5–15)
BILIRUBIN TOTAL: 0.3 mg/dL (ref 0.3–1.2)
BUN: 11 mg/dL (ref 6–20)
CO2: 23 mmol/L (ref 22–32)
Calcium: 9.1 mg/dL (ref 8.9–10.3)
Chloride: 109 mmol/L (ref 98–111)
Creatinine, Ser: 0.74 mg/dL (ref 0.44–1.00)
GFR calc Af Amer: >60 mL/min (ref >60)
GFR calc non Af Amer: >60 mL/min (ref >60)
Glucose, Bld: 97 mg/dL (ref 70–99)
Potassium: 3.7 mmol/L (ref 3.5–5.1)
Sodium: 139 mmol/L (ref 135–145)
TOTAL PROTEIN: 8.2 g/dL — AB (ref 6.5–8.1)

## 2018-09-20 LAB — CBC WITH DIFFERENTIAL/PLATELET
Abs Immature Granulocytes: 0.02 10*3/uL (ref 0.00–0.07)
Basophils Absolute: 0 10*3/uL (ref 0.0–0.1)
Basophils Relative: 1 %
EOS ABS: 0.1 10*3/uL (ref 0.0–0.5)
EOS PCT: 1 %
HCT: 39.2 % (ref 36.0–46.0)
Hemoglobin: 11.6 g/dL — ABNORMAL LOW (ref 12.0–15.0)
Immature Granulocytes: 0 %
Lymphocytes Relative: 25 %
Lymphs Abs: 1.9 10*3/uL (ref 0.7–4.0)
MCH: 25.2 pg — AB (ref 26.0–34.0)
MCHC: 29.6 g/dL — ABNORMAL LOW (ref 30.0–36.0)
MCV: 85.2 fL (ref 80.0–100.0)
Monocytes Absolute: 0.5 10*3/uL (ref 0.1–1.0)
Monocytes Relative: 7 %
Neutro Abs: 5 10*3/uL (ref 1.7–7.7)
Neutrophils Relative %: 66 %
Platelets: 333 10*3/uL (ref 150–400)
RBC: 4.6 MIL/uL (ref 3.87–5.11)
RDW: 15.7 % — ABNORMAL HIGH (ref 11.5–15.5)
WBC: 7.6 10*3/uL (ref 4.0–10.5)
nRBC: 0 % (ref 0.0–0.2)

## 2018-09-20 LAB — URINALYSIS, ROUTINE W REFLEX MICROSCOPIC
BILIRUBIN URINE: NEGATIVE
Glucose, UA: NEGATIVE mg/dL
Hgb urine dipstick: NEGATIVE
KETONES UR: NEGATIVE mg/dL
Leukocytes, UA: NEGATIVE
Nitrite: NEGATIVE
Protein, ur: NEGATIVE mg/dL
Specific Gravity, Urine: 1.01 (ref 1.005–1.030)
pH: 7.5 (ref 5.0–8.0)

## 2018-09-20 LAB — PREGNANCY, URINE: Preg Test, Ur: NEGATIVE

## 2018-09-20 LAB — LIPASE, BLOOD: LIPASE: 34 U/L (ref 11–51)

## 2018-09-20 MED ORDER — CYCLOBENZAPRINE HCL 10 MG PO TABS: 10.0000 mg | ORAL_TABLET | Freq: Two times a day (BID) | ORAL | 0 refills | Status: DC | PRN

## 2018-09-20 MED ORDER — IBUPROFEN 600 MG PO TABS: 600.0000 mg | ORAL_TABLET | Freq: Four times a day (QID) | ORAL | 0 refills | Status: DC | PRN

## 2018-09-20 MED ORDER — ONDANSETRON HCL 4 MG/2ML IJ SOLN
4.0000 mg | Freq: Once | INTRAMUSCULAR | Status: AC
  Administered 2018-09-20: 4 mg via INTRAVENOUS

## 2018-09-20 MED ORDER — ONDANSETRON HCL 4 MG/2ML IJ SOLN
INTRAMUSCULAR | Status: AC
  Filled 2018-09-20: qty 2

## 2018-09-20 MED ORDER — IOPAMIDOL (ISOVUE-300) INJECTION 61%
100.0000 mL | Freq: Once | INTRAVENOUS | Status: AC | PRN
  Administered 2018-09-20: 100 mL via INTRAVENOUS

## 2018-09-20 MED ORDER — MORPHINE SULFATE (PF) 4 MG/ML IV SOLN
4.0000 mg | Freq: Once | INTRAVENOUS | Status: AC
  Administered 2018-09-20: 4 mg via INTRAVENOUS
  Filled 2018-09-20: qty 1

## 2018-09-20 NOTE — ED Triage Notes (Signed)
R flank pain with nausea since this morning.

## 2018-09-20 NOTE — ED Notes (Signed)
Patient transported to CT 

## 2018-09-20 NOTE — ED Provider Notes (Signed)
[facility] EMERGENCY DEPARTMENT Provider Note   CSN: [id number] Arrival date & time: 09/20/18  1604     History   Chief Complaint Chief Complaint  Patient presents with  . Flank Pain    HPI Carmen Gutierrez is a 48 y.o. female.  The history is provided by the patient and medical records. No language interpreter was used.  Flank Pain      48 year old female with history of kidney stones presenting complaining of right flank pain.  Patient reports 6 days ago she developed pain to her right flank.  Pain is described as a sharp sensation radiates to the front of abdomen, worsening with positional change and with movement.  Pain was intense for several days, resolved however for the past 2 days pain has returned.  She mentioned when the pain is intense, she is incapacitated.  She does endorse nausea without vomiting.  No complaints of vomiting or diarrhea, no chest pain shortness of breath or productive cough, no blood in urine, vaginal bleeding or vaginal discharge.  No rash.  She denies any heavy lifting or recent strength activity.  She has had prior lithotripsy procedure for kidney stones.  She has remote history of breast cancer status post radiation therapy.  She denies any specific treatment tried at home.  Pain is moderate to severe.  Past Medical History:  Diagnosis Date  . Anxiety   . Breast cancer ([place]) 2014   right breast  . Cancer ([place])   . Depression   . GERD (gastroesophageal reflux disease)   . History of kidney stones   . Migraines   . Personal history of radiation therapy 2014   R  . Restless leg syndrome     Patient Active Problem List   Diagnosis Date Noted  . Aphasia 10/14/2016  . Syncope 10/14/2016  . Migraines 10/14/2016  . GERD (gastroesophageal reflux disease) 10/14/2016  . Restless leg syndrome 10/14/2016  . [place]     Past Surgical History:  Procedure Laterality Date  . ABDOMINAL HYSTERECTOMY    . BREAST BIOPSY    . BREAST  LUMPECTOMY Right 2014  . BREAST SURGERY    . c-sections x2    . CHOLECYSTECTOMY    . EXTRACORPOREAL SHOCK WAVE LITHOTRIPSY Left 03/30/2018   Procedure: LEFT EXTRACORPOREAL SHOCK WAVE LITHOTRIPSY (ESWL);  Surgeon: Cleon Gustin, MD;  Location: [facility];  Service: Urology;  Laterality: Left;  . HEMORROIDECTOMY       OB History   No obstetric history on file.      Home Medications    Prior to Admission medications   Medication Sig Start Date End Date Taking? Authorizing Provider  ergocalciferol (VITAMIN D2) 50000 units capsule Take 50,000 Units by mouth once a week.    [provider]  escitalopram (LEXAPRO) 20 MG tablet Take 20 mg by mouth at bedtime. 05/11/17   [provider]  gabapentin (NEURONTIN) 300 MG capsule Take 300 mg by mouth at bedtime.     [provider]  omeprazole (PRILOSEC) 40 MG capsule Take 40 mg by mouth daily.    [provider]  ondansetron (ZOFRAN) 4 MG tablet Take 4 mg by mouth every 8 (eight) hours as needed for nausea or vomiting.    [provider]  oxyCODONE-acetaminophen (PERCOCET) 10-325 MG tablet Take 1 tablet by mouth every 6 (six) hours as needed for pain.    [provider]  pramipexole (MIRAPEX) 0.25 MG tablet Take 0.25 mg by mouth 2 (  two) times daily.  09/22/16   [provider]  rizatriptan (MAXALT) 10 MG tablet Take 10 mg by mouth as needed for migraine. May repeat in 2 hours if needed    [provider]  tamsulosin (FLOMAX) 0.4 MG CAPS capsule Take 0.4 mg by mouth.    [provider]  topiramate (TOPAMAX) 200 MG tablet Take 200 mg by mouth daily.     [provider]    Family History No family history on file.  Social History Social History   Tobacco Use  . Smoking status: Never Smoker  . Smokeless tobacco: Never Used  Substance Use Topics  . Alcohol use: Yes    Comment: occasional  . Drug use: No     Allergies   Gadolinium derivatives;  Shellfish allergy; Dilaudid [hydromorphone hcl]; Phenergan [promethazine hcl]; and Reglan [metoclopramide]   Review of Systems Review of Systems  Genitourinary: Positive for flank pain.  All other systems reviewed and are negative.    Physical Exam Updated Vital Signs BP (!) 123/92 (BP Location: Left Arm)   Pulse 81   Temp 98.1 F (36.7 C) (Oral)   Resp 20   Ht 5\' 4"  (1.626 m)   Wt 102.1 kg   SpO2 99%   BMI 38.62 kg/m   Physical Exam Vitals signs and nursing note reviewed.  Constitutional:      General: She is not in acute distress.    Appearance: She is well-developed.  HENT:     Head: Atraumatic.  Eyes:     Conjunctiva/sclera: Conjunctivae normal.  Neck:     Musculoskeletal: Neck supple.  Cardiovascular:     Rate and Rhythm: Normal rate and regular rhythm.  Pulmonary:     Effort: Pulmonary effort is normal.     Breath sounds: Normal breath sounds. No wheezing, rhonchi or rales.  Abdominal:     Palpations: Abdomen is soft.     Tenderness: There is no abdominal tenderness.  Genitourinary:    Comments: Right CVA tenderness on percussion. Skin:    Findings: No rash.  Neurological:     Mental Status: She is alert.      ED Treatments / Results  Labs (all labs ordered are listed, but only abnormal results are displayed) Labs Reviewed  CBC WITH DIFFERENTIAL/PLATELET - Abnormal; Notable for the following components:      Result Value   Hemoglobin 11.6 (*)    MCH 25.2 (*)    MCHC 29.6 (*)    RDW 15.7 (*)    All other components within normal limits  COMPREHENSIVE METABOLIC PANEL - Abnormal; Notable for the following components:   Total Protein 8.2 (*)    All other components within normal limits  URINALYSIS, ROUTINE W REFLEX MICROSCOPIC  PREGNANCY, URINE  LIPASE, BLOOD    EKG None  Radiology Ct Abdomen Pelvis W Contrast  Result Date: 09/20/2018 CLINICAL DATA:  Flank pain EXAM: CT ABDOMEN AND PELVIS WITH CONTRAST TECHNIQUE: Multidetector CT imaging  of the abdomen and pelvis was performed using the standard protocol following bolus administration of intravenous contrast. CONTRAST:  167mL ISOVUE-300 IOPAMIDOL (ISOVUE-300) INJECTION 61% COMPARISON:  CT 03/23/2018 FINDINGS: Lower chest: Lung bases demonstrate no consolidation or effusion. The heart size is normal Hepatobiliary: No focal liver abnormality is seen. Status post cholecystectomy. No biliary dilatation. Pancreas: Unremarkable. No pancreatic ductal dilatation or surrounding inflammatory changes. Spleen: Normal in size without focal abnormality. Adrenals/Urinary Tract: Adrenal glands are normal. No hydronephrosis. Punctate stone lower pole left kidney. Bladder normal  Stomach/Bowel: Stomach is nonenlarged. No dilated small bowel. Proximal appendix is normal. Slightly enlarged appendiceal tip up to 9 mm but no surrounding inflammation. Vascular/Lymphatic: No significant vascular findings are present. No enlarged abdominal or pelvic lymph nodes. Reproductive: Status post hysterectomy. No adnexal masses. Other: Negative for free air or free fluid. 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 No acute or suspicious abnormality. IMPRESSION: 1. Negative for hydronephrosis. Punctate nonobstructing stone in the left kidney 2. Slightly enlarged appendiceal tip up to 9 mm but with otherwise normal appearance of the appendix and no surrounding soft tissue inflammatory process. Electronically Signed   By: Donavan Foil M.D.   On: 09/20/2018 17:39    Procedures Procedures (including critical care time)  Medications Ordered in ED Medications - No data to display   Initial Impression / Assessment and Plan / ED Course  I have reviewed the triage vital signs and the nursing notes.  Pertinent labs & imaging results that were available during my care of the patient were reviewed by me and considered in my medical decision making (see chart for details).     BP (!) 123/92 (BP Location: Left Arm)   Pulse 81    Temp 98.1 F (36.7 C) (Oral)   Resp 20   Ht 5\' 4"  (1.626 m)   Wt 102.1 kg   SpO2 99%   BMI 38.62 kg/m    Final Clinical Impressions(s) / ED Diagnoses   Final diagnoses:  Right flank pain    ED Discharge Orders         Ordered    cyclobenzaprine (FLEXERIL) 10 MG tablet  2 times daily PRN     09/20/18 1908    ibuprofen (ADVIL,MOTRIN) 600 MG tablet  Every 6 hours PRN     09/20/18 1908         4:29 PM Patient with history of kidney stone here with right flank pain.  She does have CVA tenderness on exam.  She also has history of breast cancer in the past and a UA without any blood in urine, I will consider CT scan for further evaluation.  Pain medication and antinausea medication given.  7:06 PM Labs are reassuring, normal normal WBC,, normal lipase, normal UA, pregnancy test is negative, and abdominal and pelvis CT scan is negative for hydronephrosis.  Evidence of punctate nonobstructive stone in the left kidney which is not related to patient's pain.  A slightly enlarged appendiceal tip measuring up to 9 mm but otherwise normal appearance.  Patient does not have any significant pain to the right lower quadrant to suggest appendicitis at this time.  Currently, no acute emergent medical condition identified.  Will provide symptomatic treatment for suspect muscles pain as her pain is worsening with movement however patient understands to return promptly if her condition worsen.  No rash to suggest shingles and patient does not have any lower pelvic pain to suggest GU pathology.   Domenic Moras, PA-C 09/20/18 Creed Copper, MD 09/21/18 2200

## 2019-05-21 ENCOUNTER — Other Ambulatory Visit: Payer: Self-pay

## 2019-05-21 ENCOUNTER — Telehealth: Payer: No Typology Code available for payment source | Admitting: Neurology

## 2019-10-26 ENCOUNTER — Other Ambulatory Visit: Payer: Self-pay | Admitting: Family

## 2019-10-26 DIAGNOSIS — Z9889 Other specified postprocedural states: Secondary | ICD-10-CM

## 2020-07-23 ENCOUNTER — Other Ambulatory Visit (HOSPITAL_COMMUNITY): Payer: Self-pay | Admitting: Family

## 2020-08-08 ENCOUNTER — Other Ambulatory Visit: Payer: Self-pay

## 2020-08-08 ENCOUNTER — Other Ambulatory Visit (HOSPITAL_COMMUNITY): Payer: Self-pay | Admitting: Internal Medicine

## 2020-08-08 ENCOUNTER — Emergency Department (HOSPITAL_BASED_OUTPATIENT_CLINIC_OR_DEPARTMENT_OTHER)
Admission: EM | Admit: 2020-08-08 | Discharge: 2020-08-08 | Disposition: A | Discharge status: Home / Self Care | Payer: 59 | Attending: Emergency Medicine | Admitting: Emergency Medicine

## 2020-08-08 ENCOUNTER — Encounter (HOSPITAL_BASED_OUTPATIENT_CLINIC_OR_DEPARTMENT_OTHER): Payer: Self-pay | Admitting: Emergency Medicine

## 2020-08-08 DIAGNOSIS — Z853 Personal history of malignant neoplasm of breast: Secondary | ICD-10-CM | POA: Insufficient documentation

## 2020-08-08 DIAGNOSIS — U071 COVID-19: Secondary | ICD-10-CM | POA: Insufficient documentation

## 2020-08-08 DIAGNOSIS — R059 Cough, unspecified: Secondary | ICD-10-CM | POA: Diagnosis present

## 2020-08-08 LAB — CBC
HCT: 34.3 % — ABNORMAL LOW (ref 36.0–46.0)
Hemoglobin: 10.7 g/dL — ABNORMAL LOW (ref 12.0–15.0)
MCH: 23.9 pg — ABNORMAL LOW (ref 26.0–34.0)
MCHC: 31.2 g/dL (ref 30.0–36.0)
MCV: 76.6 fL — ABNORMAL LOW (ref 80.0–100.0)
Platelets: 237 10*3/uL (ref 150–400)
RBC: 4.48 MIL/uL (ref 3.87–5.11)
RDW: 16.4 % — ABNORMAL HIGH (ref 11.5–15.5)
WBC: 3.8 10*3/uL — ABNORMAL LOW (ref 4.0–10.5)
nRBC: 0 % (ref 0.0–0.2)

## 2020-08-08 LAB — COMPREHENSIVE METABOLIC PANEL
ALT: 25 U/L (ref 0–44)
AST: 33 U/L (ref 15–41)
Albumin: 3.9 g/dL (ref 3.5–5.0)
Alkaline Phosphatase: 59 U/L (ref 38–126)
Anion gap: 12 (ref 5–15)
BUN: 8 mg/dL (ref 6–20)
CO2: 22 mmol/L (ref 22–32)
Calcium: 8.9 mg/dL (ref 8.9–10.3)
Chloride: 98 mmol/L (ref 98–111)
Creatinine, Ser: 0.76 mg/dL (ref 0.44–1.00)
GFR, Estimated: >60 mL/min (ref >60)
Glucose, Bld: 165 mg/dL — ABNORMAL HIGH (ref 70–99)
Potassium: 3.5 mmol/L (ref 3.5–5.1)
Sodium: 132 mmol/L — ABNORMAL LOW (ref 135–145)
Total Bilirubin: 0.2 mg/dL — ABNORMAL LOW (ref 0.3–1.2)
Total Protein: 8 g/dL (ref 6.5–8.1)

## 2020-08-08 LAB — LIPASE, BLOOD: Lipase: 44 U/L (ref 11–51)

## 2020-08-08 MED ORDER — KETOROLAC TROMETHAMINE 30 MG/ML IJ SOLN
30.0000 mg | Freq: Once | INTRAMUSCULAR | Status: AC
  Administered 2020-08-08: 30 mg via INTRAVENOUS
  Filled 2020-08-08: qty 1

## 2020-08-08 MED ORDER — ONDANSETRON HCL 4 MG/2ML IJ SOLN: 4.0000 mg | Freq: Once | INTRAMUSCULAR | Status: DC | PRN

## 2020-08-08 MED ORDER — ONDANSETRON 8 MG PO TBDP: 8.0000 mg | ORAL_TABLET | Freq: Three times a day (TID) | ORAL | 0 refills | Status: DC | PRN

## 2020-08-08 MED ORDER — ONDANSETRON HCL 4 MG/2ML IJ SOLN
4.0000 mg | Freq: Once | INTRAMUSCULAR | Status: AC
  Administered 2020-08-08: 4 mg via INTRAVENOUS
  Filled 2020-08-08: qty 2

## 2020-08-08 MED ORDER — SODIUM CHLORIDE 0.9 % IV BOLUS
500.0000 mL | Freq: Once | INTRAVENOUS | Status: AC
  Administered 2020-08-08: 500 mL via INTRAVENOUS

## 2020-08-08 NOTE — ED Triage Notes (Signed)
Pt reports testing Covid positive yesterday. Pt states symptoms of fever and vomiting x 5 days.

## 2020-08-08 NOTE — ED Provider Notes (Signed)
[facility] EMERGENCY DEPARTMENT Provider Note   CSN: [id number] Arrival date & time: 08/08/20  1944     History Chief Complaint  Patient presents with  . Covid Positive    Carmen Gutierrez is a 49 y.o. female.  HPI   Pt started having covid sx on Monday.  She initially thought it was related to her iron infusion.  She developed body aches.   She then developed nausea and vomiting.  Pt has had a headache.  She also has been coughing.  Pt was tested on Wednesday.  Her doctor started her on prednisone.  Pt has not been vaccinated.  Increasing headache today.  Persistent nausea so she came to the ED.  No shortness of breath.    Past Medical History:  Diagnosis Date  . Anxiety   . Breast cancer ([place]) 2014   right breast  . Cancer ([place])   . Depression   . GERD (gastroesophageal reflux disease)   . History of kidney stones   . Migraines   . Personal history of radiation therapy 2014   R  . Restless leg syndrome     Patient Active Problem List   Diagnosis Date Noted  . Aphasia 10/14/2016  . Syncope 10/14/2016  . Migraines 10/14/2016  . GERD (gastroesophageal reflux disease) 10/14/2016  . Restless leg syndrome 10/14/2016  . [place]     Past Surgical History:  Procedure Laterality Date  . ABDOMINAL HYSTERECTOMY    . BREAST BIOPSY    . BREAST LUMPECTOMY Right 2014  . BREAST SURGERY    . c-sections x2    . CHOLECYSTECTOMY    . EXTRACORPOREAL SHOCK WAVE LITHOTRIPSY Left 03/30/2018   Procedure: LEFT EXTRACORPOREAL SHOCK WAVE LITHOTRIPSY (ESWL);  Surgeon: Cleon Gustin, MD;  Location: [facility];  Service: Urology;  Laterality: Left;  . HEMORROIDECTOMY       OB History   No obstetric history on file.     No family history on file.  Social History   Tobacco Use  . Smoking status: Never Smoker  . Smokeless tobacco: Never Used  Vaping Use  . Vaping Use: Never used  Substance Use Topics  . Alcohol use: Yes    Comment: occasional  . Drug use: No     Home Medications Prior to Admission medications   Medication Sig Start Date End Date Taking? Authorizing Provider  cyclobenzaprine (FLEXERIL) 10 MG tablet Take 1 tablet (10 mg total) by mouth 2 (two) times daily as needed for muscle spasms. 09/20/18   Domenic Moras, PA-C  ergocalciferol (VITAMIN D2) 50000 units capsule Take 50,000 Units by mouth once a week.    [provider]  escitalopram (LEXAPRO) 20 MG tablet Take 20 mg by mouth at bedtime. 05/11/17   [provider]  gabapentin (NEURONTIN) 300 MG capsule Take 300 mg by mouth at bedtime.     [provider]  ibuprofen (ADVIL,MOTRIN) 600 MG tablet Take 1 tablet (600 mg total) by mouth every 6 (six) hours as needed. 09/20/18   Domenic Moras, PA-C  omeprazole (PRILOSEC) 40 MG capsule Take 40 mg by mouth daily.    [provider]  ondansetron (ZOFRAN ODT) 8 MG disintegrating tablet Take 1 tablet (8 mg total) by mouth every 8 (eight) hours as needed for nausea or vomiting. 08/08/20   Dorie Rank, MD  ondansetron (ZOFRAN) 4 MG tablet Take 4 mg by mouth every 8 (eight) hours as needed for nausea or vomiting.    [provider]  oxyCODONE-acetaminophen (PERCOCET) 10-325 MG tablet Take 1 tablet by mouth every 6 (six) hours as needed for pain.    [provider]  pramipexole (MIRAPEX) 0.25 MG tablet Take 0.25 mg by mouth 2 (two) times daily.  09/22/16   [provider]  rizatriptan (MAXALT) 10 MG tablet Take 10 mg by mouth as needed for migraine. May repeat in 2 hours if needed    [provider]  tamsulosin (FLOMAX) 0.4 MG CAPS capsule Take 0.4 mg by mouth.    [provider]  topiramate (TOPAMAX) 200 MG tablet Take 200 mg by mouth daily.     [provider]    Allergies    Gadolinium derivatives, Shellfish allergy, Dilaudid [hydromorphone hcl], Phenergan [promethazine hcl], and Reglan [metoclopramide]  Review of Systems   Review of Systems  All other systems  reviewed and are negative.   Physical Exam Updated Vital Signs BP 109/67 (BP Location: Left Arm)   Pulse 60   Temp 98.8 F (37.1 C) (Oral)   Resp 18   Ht 1.626 m (5\' 4" )   Wt 99.8 kg   SpO2 98%   BMI 37.76 kg/m   Physical Exam Vitals and nursing note reviewed.  Constitutional:      General: She is not in acute distress.    Appearance: She is well-developed.  HENT:     Head: Normocephalic and atraumatic.     Right Ear: External ear normal.     Left Ear: External ear normal.  Eyes:     General: No scleral icterus.       Right eye: No discharge.        Left eye: No discharge.     Conjunctiva/sclera: Conjunctivae normal.  Neck:     Trachea: No tracheal deviation.  Cardiovascular:     Rate and Rhythm: Normal rate and regular rhythm.  Pulmonary:     Effort: Pulmonary effort is normal. No respiratory distress.     Breath sounds: Normal breath sounds. No stridor. No wheezing or rales.  Abdominal:     General: Bowel sounds are normal. There is no distension.     Palpations: Abdomen is soft.     Tenderness: There is no abdominal tenderness. There is no guarding or rebound.  Musculoskeletal:        General: No tenderness.     Cervical back: Neck supple.  Skin:    General: Skin is warm and dry.     Findings: No rash.  Neurological:     Mental Status: She is alert.     Cranial Nerves: No cranial nerve deficit (no facial droop, extraocular movements intact, no slurred speech).     Sensory: No sensory deficit.     Motor: No abnormal muscle tone or seizure activity.     Coordination: Coordination normal.     ED Results / Procedures / Treatments   Labs (all labs ordered are listed, but only abnormal results are displayed) Labs Reviewed  COMPREHENSIVE METABOLIC PANEL - Abnormal; Notable for the following components:      Result Value   Sodium 132 (*)    Glucose, Bld 165 (*)    Total Bilirubin 0.2 (*)    All other components within normal limits  CBC - Abnormal; Notable  for the following components:   WBC 3.8 (*)    Hemoglobin 10.7 (*)    HCT 34.3 (*)    MCV 76.6 (*)    MCH 23.9 (*)    RDW 16.4 (*)  All other components within normal limits  LIPASE, BLOOD    EKG None  Radiology No results found.  Procedures Procedures (including critical care time)  Medications Ordered in ED Medications  ondansetron (ZOFRAN) injection 4 mg (has no administration in time range)  ketorolac (TORADOL) 30 MG/ML injection 30 mg (30 mg Intravenous Given 08/08/20 2305)  ondansetron (ZOFRAN) injection 4 mg (4 mg Intravenous Given 08/08/20 2305)  sodium chloride 0.9 % bolus 500 mL (500 mLs Intravenous New Bag/Given 08/08/20 2305)    ED Course  I have reviewed the triage vital signs and the nursing notes.  Pertinent labs & imaging results that were available during my care of the patient were reviewed by me and considered in my medical decision making (see chart for details).    MDM Rules/Calculators/A&P                         Carmen Gutierrez was evaluated in Emergency Department on 08/08/2020 for the symptoms described in the history of present illness. She was evaluated in the context of the global COVID-19 pandemic, which necessitated consideration that the patient might be at risk for infection with the SARS-CoV-2 virus that causes COVID-19. Institutional protocols and algorithms that pertain to the evaluation of patients at risk for COVID-19 are in a state of rapid change based on information released by regulatory bodies including the CDC and federal and state organizations. These policies and algorithms were followed during the patient's care in the ED.  Patient presented with symptoms associated with known Covid illness.  Patient vital signs are reassuring.  She has a normal oxygenation.  Lungs are clear.  Patient's was treated with Toradol Zofran and a small fluid bolus for her nausea and headache.  I discussed monoclonal antibody infusion with the  patient and she is interested in this treatment.  I have sent a message to the monoclonal antibody infusion clinic to follow-up with the patient and screen her for Mab infusion   Final Clinical Impression(s) / ED Diagnoses Final diagnoses:  COVID-19 virus infection    Rx / DC Orders ED Discharge Orders         Ordered    ondansetron (ZOFRAN ODT) 8 MG disintegrating tablet  Every 8 hours PRN        08/08/20 2312           Dorie Rank, MD 08/08/20 2313

## 2020-08-08 NOTE — ED Notes (Signed)
States she is covid positive, has not felt well for the past 5 days, having nausea/ vomiting, HA, fevers.

## 2020-08-08 NOTE — ED Notes (Signed)
Called to waiting room patient states she feels like she is going to pass out (feels like she is spinning) Vitals taken listed below. Patient resting on chair says head is pounding, RN Kellie notified of vitals and patient concerns.

## 2020-08-08 NOTE — Discharge Instructions (Addendum)
Take over-the-counter medication as needed for your headache.  Take Zofran for nausea.  The monoclonal antibody infusion clinic should contact you about further treatment.  Return as needed for worsening symptoms

## 2020-08-09 ENCOUNTER — Telehealth (HOSPITAL_COMMUNITY): Payer: Self-pay

## 2020-08-09 ENCOUNTER — Telehealth: Payer: Self-pay | Admitting: Unknown Physician Specialty

## 2020-08-09 NOTE — Telephone Encounter (Signed)
Called to Discuss with patient about Covid symptoms and the use of the monoclonal antibody infusion for those with mild to moderate Covid symptoms and at a high risk of hospitalization.     Pt appears to qualify for this infusion due to co-morbid conditions and/or a member of an at-risk group in accordance with the FDA Emergency Use Authorization.    Unable to reach pt    

## 2020-08-09 NOTE — Telephone Encounter (Signed)
Called to Discuss with patient about Covid symptoms and the use of the monoclonal antibody infusion for those with mild to moderate Covid symptoms and at a high risk of hospitalization.     Pt appears to qualify for this infusion due to co-morbid conditions and/or a member of an at-risk group in accordance with the FDA Emergency Use Authorization.    Unable to reach pt. Left VM message to return phone call at Hotline [phone].

## 2020-08-10 ENCOUNTER — Telehealth: Payer: Self-pay | Admitting: Physician Assistant

## 2020-08-10 NOTE — Telephone Encounter (Signed)
Called to Discuss with patient about Covid symptoms and the use of the monoclonal antibody infusion for those with mild to moderate Covid symptoms and at a high risk of hospitalization.     Pt appears to qualify for this infusion due to co-morbid conditions and/or a member of an at-risk group in accordance with the FDA Emergency Use Authorization.    Unable to reach pt. Left voice mail to call back.

## 2020-12-12 ENCOUNTER — Other Ambulatory Visit (HOSPITAL_BASED_OUTPATIENT_CLINIC_OR_DEPARTMENT_OTHER): Payer: Self-pay

## 2020-12-26 ENCOUNTER — Other Ambulatory Visit (HOSPITAL_COMMUNITY): Payer: Self-pay

## 2021-05-06 ENCOUNTER — Other Ambulatory Visit (HOSPITAL_COMMUNITY): Payer: Self-pay

## 2021-10-12 DIAGNOSIS — E2839 Other primary ovarian failure: Secondary | ICD-10-CM | POA: Diagnosis not present

## 2021-10-12 DIAGNOSIS — Z1331 Encounter for screening for depression: Secondary | ICD-10-CM | POA: Diagnosis not present

## 2021-10-12 DIAGNOSIS — Z0000 Encounter for general adult medical examination without abnormal findings: Secondary | ICD-10-CM | POA: Diagnosis not present

## 2021-10-12 DIAGNOSIS — Z8639 Personal history of other endocrine, nutritional and metabolic disease: Secondary | ICD-10-CM | POA: Diagnosis not present

## 2021-10-12 DIAGNOSIS — Z1231 Encounter for screening mammogram for malignant neoplasm of breast: Secondary | ICD-10-CM | POA: Diagnosis not present

## 2021-10-12 DIAGNOSIS — Z131 Encounter for screening for diabetes mellitus: Secondary | ICD-10-CM | POA: Diagnosis not present

## 2021-10-12 DIAGNOSIS — K219 Gastro-esophageal reflux disease without esophagitis: Secondary | ICD-10-CM | POA: Diagnosis not present

## 2021-10-12 DIAGNOSIS — E7800 Pure hypercholesterolemia, unspecified: Secondary | ICD-10-CM | POA: Diagnosis not present

## 2021-10-26 DIAGNOSIS — R200 Anesthesia of skin: Secondary | ICD-10-CM | POA: Diagnosis not present

## 2021-10-26 DIAGNOSIS — M65321 Trigger finger, right index finger: Secondary | ICD-10-CM | POA: Diagnosis not present

## 2021-11-24 ENCOUNTER — Other Ambulatory Visit: Payer: Self-pay | Admitting: Family

## 2021-11-24 DIAGNOSIS — Z9889 Other specified postprocedural states: Secondary | ICD-10-CM

## 2021-12-04 DIAGNOSIS — N281 Cyst of kidney, acquired: Secondary | ICD-10-CM | POA: Diagnosis not present

## 2021-12-08 DIAGNOSIS — D649 Anemia, unspecified: Secondary | ICD-10-CM | POA: Diagnosis not present

## 2021-12-11 ENCOUNTER — Ambulatory Visit
Admission: RE | Admit: 2021-12-11 | Discharge: 2021-12-11 | Disposition: A | Discharge status: Home / Self Care | Payer: BC Managed Care – PPO | Source: Ambulatory Visit | Attending: Family | Admitting: Family

## 2021-12-11 DIAGNOSIS — Z853 Personal history of malignant neoplasm of breast: Secondary | ICD-10-CM | POA: Diagnosis not present

## 2021-12-11 DIAGNOSIS — R922 Inconclusive mammogram: Secondary | ICD-10-CM | POA: Diagnosis not present

## 2021-12-11 DIAGNOSIS — Z9889 Other specified postprocedural states: Secondary | ICD-10-CM

## 2021-12-18 DIAGNOSIS — K760 Fatty (change of) liver, not elsewhere classified: Secondary | ICD-10-CM | POA: Diagnosis not present

## 2021-12-18 DIAGNOSIS — N281 Cyst of kidney, acquired: Secondary | ICD-10-CM | POA: Diagnosis not present

## 2022-08-27 ENCOUNTER — Emergency Department: Payer: Self-pay

## 2022-08-27 ENCOUNTER — Other Ambulatory Visit: Payer: Self-pay

## 2022-08-27 ENCOUNTER — Emergency Department
Admission: EM | Admit: 2022-08-27 | Discharge: 2022-08-27 | Disposition: A | Discharge status: Home / Self Care | Payer: Self-pay | Attending: Emergency Medicine | Admitting: Emergency Medicine

## 2022-08-27 DIAGNOSIS — R55 Syncope and collapse: Secondary | ICD-10-CM | POA: Insufficient documentation

## 2022-08-27 LAB — BASIC METABOLIC PANEL
Anion gap: 6 (ref 5–15)
BUN: 17 mg/dL (ref 6–20)
CO2: 23 mmol/L (ref 22–32)
Calcium: 9.7 mg/dL (ref 8.9–10.3)
Chloride: 109 mmol/L (ref 98–111)
Creatinine, Ser: 0.38 mg/dL — ABNORMAL LOW (ref 0.44–1.00)
GFR, Estimated: >60 mL/min (ref >60)
Glucose, Bld: 114 mg/dL — ABNORMAL HIGH (ref 70–99)
Potassium: 4.2 mmol/L (ref 3.5–5.1)
Sodium: 138 mmol/L (ref 135–145)

## 2022-08-27 LAB — CBC
HCT: 35.9 % — ABNORMAL LOW (ref 36.0–46.0)
Hemoglobin: 11.7 g/dL — ABNORMAL LOW (ref 12.0–15.0)
MCH: 28.6 pg (ref 26.0–34.0)
MCHC: 32.6 g/dL (ref 30.0–36.0)
MCV: 87.8 fL (ref 80.0–100.0)
Platelets: 253 10*3/uL (ref 150–400)
RBC: 4.09 MIL/uL (ref 3.87–5.11)
RDW: 13.5 % (ref 11.5–15.5)
WBC: 5.4 10*3/uL (ref 4.0–10.5)
nRBC: 0 % (ref 0.0–0.2)

## 2022-08-27 LAB — TROPONIN I (HIGH SENSITIVITY): Troponin I (High Sensitivity): 5 ng/L (ref <18)

## 2022-08-27 NOTE — Discharge Instructions (Addendum)
-  Please follow-up with your primary care provider sometime within the next week as discussed for reevaluation.  -Make sure to hydrate frequently and eat full meals over the next few days.  -If at anytime you begin to experience any new or worsening symptoms, please return  to the emergency department or call 911.

## 2022-08-27 NOTE — ED Triage Notes (Signed)
Pt comes via EMS from work with c/o syncopal episode. Pt states she was walking and then must have passed out. Pt did hit head, no thinners.  Pt states this am she wasn't feeling good.

## 2022-08-27 NOTE — ED Provider Notes (Signed)
[facility] Provider Note    Event Date/Time   First MD Initiated Contact with Patient 08/27/22 1346     (approximate)   History   Chief Complaint Loss of Consciousness   HPI Carmen Gutierrez is a 51 y.o. female, history of GERD, exactness depression, restless leg syndrome, presents to the emergency department for evaluation of syncopal episode.  She states that she was working at [employer] when she suddenly began to feel lightheaded.  She was standing at the time when someone approached her she suddenly passed out.  When she woke up, everybody was crowded around her.  She stated to them that she felt fine, however they called EMS anyway.  She states that this has happened to her before in the past, with ultimately negative workups.  She states that she has been stressed recently and was unable to sleep last night.  She endorses some mild headaches, otherwise no symptoms.  Denies fever/chills, chest pain, shortness of breath, abdominal pain, leg swelling, flank pain, nausea/vomiting, vision change, hearing changes, numbness/tingling in the upper or lower extremities, weakness, or dizziness/lightheadedness.  History Limitations: No limitations.        Physical Exam  Triage Vital Signs: ED Triage Vitals  Enc Vitals Group     BP 08/27/22 1136 111/74     Pulse Rate 08/27/22 1136 (!) 114     Resp 08/27/22 1136 18     Temp 08/27/22 1136 98.6 F (37 C)     Temp src --      SpO2 08/27/22 1136 98 %     Weight --      Height --      Head Circumference --      Peak Flow --      Pain Score 08/27/22 1135 6     Pain Loc --      Pain Edu? --      Excl. in [place]? --     Most recent vital signs: Vitals:   08/27/22 1136  BP: 111/74  Pulse: (!) 114  Resp: 18  Temp: 98.6 F (37 C)  SpO2: 98%    General: Awake, NAD.  She is tearful in the room and expressed frustration about her coworkers making a big deal out of this incident. Skin: Warm, dry. No rashes or  lesions.  Eyes: PERRL. Conjunctivae normal.  CV: Good peripheral perfusion.  S1 and S2 present.  No murmurs, rubs, or gallops. Resp: Normal effort.  Lung sounds clear bilaterally. Abd: Soft, non-tender. No distention.  Neuro: At baseline. No gross neurological deficits.  Musculoskeletal: Normal ROM of all extremities.  Focused Exam: No gross deformities to the head/neck.  Full range of motion of the head/neck.  She is able to ambulate well on her own without difficulty.  Physical Exam    ED Results / Procedures / Treatments  Labs (all labs ordered are listed, but only abnormal results are displayed) Labs Reviewed  BASIC METABOLIC PANEL - Abnormal; Notable for the following components:      Result Value   Glucose, Bld 114 (*)    Creatinine, Ser 0.38 (*)    All other components within normal limits  CBC - Abnormal; Notable for the following components:   Hemoglobin 11.7 (*)    HCT 35.9 (*)    All other components within normal limits  URINALYSIS, ROUTINE W REFLEX MICROSCOPIC  CBG MONITORING, ED  POC URINE PREG, ED  TROPONIN I (HIGH SENSITIVITY)     EKG  Sinus tachycardia, rate of 108, no ST segment changes, normal QRS, no QT prolongation, no significant axis deviations.    RADIOLOGY  ED Provider Interpretation: I personally viewed and interpreted these images, head CT does not show any acute intracranial abnormalities.  CT cervical spine shows no acute fractures.  CT Cervical Spine Wo Contrast  Result Date: 08/27/2022 CLINICAL DATA:  Syncope, fall EXAM: CT CERVICAL SPINE WITHOUT CONTRAST TECHNIQUE: Multidetector CT imaging of the cervical spine was performed without intravenous contrast. Multiplanar CT image reconstructions were also generated. RADIATION DOSE REDUCTION: This exam was performed according to the departmental dose-optimization program which includes automated exposure control, adjustment of the mA and/or kV according to patient size and/or use of iterative  reconstruction technique. COMPARISON:  None Available. FINDINGS: Alignment: Alignment of posterior margins of vertebral bodies is within normal limits. Skull base and vertebrae: No recent fracture is seen. Degenerative changes are noted with bony spurs at multiple levels. Soft tissues and spinal canal: There is no central spinal stenosis. There is mild extrinsic pressure over the ventral margin of thecal sac caused by posterior bony spurs at C5-C6 level. Disc levels: There is mild to moderate encroachment of neural foramina by bony spurs from C4-C7 levels, more so at C5-C6 level. Upper chest: Few blebs are seen in the right apex. Other: Tight arteries enlarged in size. IMPRESSION: No recent fracture is seen. Cervical spondylosis with encroachment of neural foramina from C4-C7 levels, more so at C5-C6 level. Electronically Signed   By: Elmer Picker M.D.   On: 08/27/2022 12:02   CT HEAD WO CONTRAST (5MM)  Result Date: 08/27/2022 CLINICAL DATA:  Syncope, fall EXAM: CT HEAD WITHOUT CONTRAST TECHNIQUE: Contiguous axial images were obtained from the base of the skull through the vertex without intravenous contrast. RADIATION DOSE REDUCTION: This exam was performed according to the departmental dose-optimization program which includes automated exposure control, adjustment of the mA and/or kV according to patient size and/or use of iterative reconstruction technique. COMPARISON:  08/24/2021 FINDINGS: Brain: No acute intracranial findings are seen. There are no signs of bleeding within the cranium. There is no focal edema or mass effect. Ventricles are not dilated. Vascular: Unremarkable. Skull: Unremarkable. Sinuses/Orbits: Unremarkable. Other: None. IMPRESSION: No acute intracranial findings are seen in noncontrast CT brain. Electronically Signed   By: Elmer Picker M.D.   On: 08/27/2022 11:59    PROCEDURES:  Critical Care performed: N/A.  Procedures    MEDICATIONS ORDERED IN ED: Medications -  No data to display   IMPRESSION / MDM / [place] / ED COURSE  I reviewed the triage vital signs and the nursing notes.                              Differential diagnosis includes, but is not limited to, arrhythmias, aortic stenosis, vasovagal response, anemia, hypoglycemia, orthostatic hypotension.   ED Course Patient appears well, vitals within normal limits.  NAD.  CBC shows no leukocytosis.  Anemia present at 11.7 hemoglobin, consistent with her baseline.  BMP shows no significant electrolyte abnormalities or AKI.  Initial troponin 5.  EKG unremarkable.  Assessment/Plan Patient presents via EMS for evaluation of syncopal episode that occurred within the past couple hours.  She states that she feels fine and is more upset with her coworkers about making her go to the hospital.  Her physical exam is unremarkable.  No gross neurological deficits.  Her labs are overall reassuring.  Head  CT and cervical spine CT does not show any evidence of any intracranial pathology or fractures.  Given her tachycardia and unclear etiology, I did discuss with her the benefits of getting a CT angiogram to rule out pulmonary embolism or other cardiac pathology.  However, she states that she does not want any more imaging or lab work done and that she is ready to go home.  My suspicion for any serious or life-threatening pathology is low, however I did advise her to follow-up with her primary care provider soon for reevaluation.  Heavily emphasized that she should return to the emergency department anytime if she begins to experience any significant pain or recurrent episodes of syncope.  She expressed understanding and agreed.  Will discharge.  Considered admission for this patient, but given her stable presentation and unremarkable workup so far, she is unlikely benefit from admission.  Provided the patient with anticipatory guidance, return precautions, and educational material. Encouraged the  patient to return to the emergency department at any time if they begin to experience any new or worsening symptoms. Patient expressed understanding and agreed with the plan.   Patient's presentation is most consistent with acute presentation with potential threat to life or bodily function.       FINAL CLINICAL IMPRESSION(S) / ED DIAGNOSES   Final diagnoses:  Syncope, unspecified syncope type     Rx / DC Orders   ED Discharge Orders     None        Note:  This document was prepared using Dragon voice recognition software and may include unintentional dictation errors.   Teodoro Spray, [place] 08/27/22 1452    Naaman Plummer, MD 08/27/22 1534

## 2023-02-19 DIAGNOSIS — E0590 Thyrotoxicosis, unspecified without thyrotoxic crisis or storm: Secondary | ICD-10-CM | POA: Insufficient documentation

## 2023-04-05 ENCOUNTER — Emergency Department (HOSPITAL_BASED_OUTPATIENT_CLINIC_OR_DEPARTMENT_OTHER): Payer: Self-pay

## 2023-04-05 ENCOUNTER — Encounter (HOSPITAL_BASED_OUTPATIENT_CLINIC_OR_DEPARTMENT_OTHER): Payer: Self-pay

## 2023-04-05 ENCOUNTER — Other Ambulatory Visit (HOSPITAL_BASED_OUTPATIENT_CLINIC_OR_DEPARTMENT_OTHER): Payer: Self-pay

## 2023-04-05 ENCOUNTER — Emergency Department (HOSPITAL_BASED_OUTPATIENT_CLINIC_OR_DEPARTMENT_OTHER)
Admission: EM | Admit: 2023-04-05 | Discharge: 2023-04-05 | Disposition: A | Discharge status: Home / Self Care | Payer: Self-pay | Attending: Emergency Medicine | Admitting: Emergency Medicine

## 2023-04-05 ENCOUNTER — Other Ambulatory Visit: Payer: Self-pay

## 2023-04-05 ENCOUNTER — Telehealth (HOSPITAL_COMMUNITY): Payer: Self-pay

## 2023-04-05 DIAGNOSIS — Z7901 Long term (current) use of anticoagulants: Secondary | ICD-10-CM | POA: Insufficient documentation

## 2023-04-05 DIAGNOSIS — I480 Paroxysmal atrial fibrillation: Secondary | ICD-10-CM | POA: Insufficient documentation

## 2023-04-05 DIAGNOSIS — R791 Abnormal coagulation profile: Secondary | ICD-10-CM | POA: Insufficient documentation

## 2023-04-05 LAB — CBC
HCT: 40.4 % (ref 36.0–46.0)
Hemoglobin: 13.6 g/dL (ref 12.0–15.0)
MCH: 29.1 pg (ref 26.0–34.0)
MCHC: 33.7 g/dL (ref 30.0–36.0)
MCV: 86.5 fL (ref 80.0–100.0)
Platelets: 324 10*3/uL (ref 150–400)
RBC: 4.67 MIL/uL (ref 3.87–5.11)
RDW: 14.4 % (ref 11.5–15.5)
WBC: 9.4 10*3/uL (ref 4.0–10.5)
nRBC: 0 % (ref 0.0–0.2)

## 2023-04-05 LAB — MAGNESIUM: Magnesium: 2 mg/dL (ref 1.7–2.4)

## 2023-04-05 LAB — BASIC METABOLIC PANEL
Anion gap: 9 (ref 5–15)
BUN: 7 mg/dL (ref 6–20)
CO2: 23 mmol/L (ref 22–32)
Calcium: 9.5 mg/dL (ref 8.9–10.3)
Chloride: 107 mmol/L (ref 98–111)
Creatinine, Ser: 0.56 mg/dL (ref 0.44–1.00)
GFR, Estimated: >60 mL/min (ref >60)
Glucose, Bld: 93 mg/dL (ref 70–99)
Potassium: 4.6 mmol/L (ref 3.5–5.1)
Sodium: 139 mmol/L (ref 135–145)

## 2023-04-05 LAB — TROPONIN I (HIGH SENSITIVITY): Troponin I (High Sensitivity): 3 ng/L (ref <18)

## 2023-04-05 LAB — D-DIMER, QUANTITATIVE: D-Dimer, Quant: 1.38 ug/mL-FEU — ABNORMAL HIGH (ref 0.00–0.50)

## 2023-04-05 MED ORDER — SODIUM CHLORIDE 0.9 % IV BOLUS
1000.0000 mL | Freq: Once | INTRAVENOUS | Status: AC
  Administered 2023-04-05: 1000 mL via INTRAVENOUS

## 2023-04-05 MED ORDER — APIXABAN 5 MG PO TABS
5.0000 mg | ORAL_TABLET | Freq: Two times a day (BID) | ORAL | 0 refills | Status: DC
  Filled 2023-04-05: qty 60, 30d supply, fill #0

## 2023-04-05 MED ORDER — IOHEXOL 350 MG/ML SOLN
100.0000 mL | Freq: Once | INTRAVENOUS | Status: AC | PRN
  Administered 2023-04-05: 80 mL via INTRAVENOUS

## 2023-04-05 NOTE — Telephone Encounter (Signed)
Left message for patient to call back to schedule ED follow up appointment. ?

## 2023-04-05 NOTE — ED Notes (Signed)
Pt verbalized understanding of d/c instructions, meds, and followup care. Denies questions. VSS, no distress noted. Steady gait to exit with all belongings. Follow up with Afib clinic today.

## 2023-04-05 NOTE — Discharge Instructions (Signed)
Please let your family doctor and endocrinologist know that you have been found to be in atrial fibrillation.  I prescribed you a blood thinning medication this medication usually they would have you on until you are seen in the A-fib clinic.  If you continue to be in A-fib they would like you to stay on this medication for them to try to get you out of that rhythm.  Please return for worsening symptoms or if you cough up blood or if you feel like you might pass out.

## 2023-04-05 NOTE — ED Triage Notes (Signed)
Patient here POV from Home.  Notes Mid Chest Pain that began Last PM. Heavy and Constant. Mild SOB.  Some Nausea. No Dizziness.   NAD Noted during Triage. A&Ox4. GCS 15. Ambulatory.

## 2023-04-05 NOTE — ED Provider Notes (Signed)
[facility] EMERGENCY DEPARTMENT AT [facility] Provider Note   CSN: [id number] Arrival date & time: 04/05/23  1109     History  Chief Complaint  Patient presents with   Chest Pain    Carmen Gutierrez is a 52 y.o. female.  52 yo  F with a chief complaint of chest pressure.  Started yesterday afternoon.  Nothing seems to make it better or worse.  She denies cough congestion or fever.  Denies abdominal pain nausea vomiting or diarrhea.  She has been diagnosed with hypothyroidism.  She just had a medication change because her heart was racing.     Chest Pain      Home Medications Prior to Admission medications   Medication Sig Start Date End Date Taking? Authorizing Provider  apixaban (ELIQUIS) 5 MG TABS tablet Take 1 tablet (5 mg total) by mouth 2 (two) times daily. 04/05/23 05/05/23 Yes Melene Plan, DO  cyclobenzaprine (FLEXERIL) 10 MG tablet Take 1 tablet (10 mg total) by mouth 2 (two) times daily as needed for muscle spasms. 09/20/18   Fayrene Helper, PA-C  ergocalciferol (VITAMIN D2) 50000 units capsule Take 50,000 Units by mouth once a week.    [provider]  escitalopram (LEXAPRO) 10 MG tablet TAKE 1 TABLET BY MOUTH ONCE A DAY 07/23/20 07/23/21  Rolm Gala, NP  escitalopram (LEXAPRO) 20 MG tablet Take 20 mg by mouth at bedtime. 05/11/17   [provider]  gabapentin (NEURONTIN) 300 MG capsule Take 300 mg by mouth at bedtime.     [provider]  ibuprofen (ADVIL,MOTRIN) 600 MG tablet Take 1 tablet (600 mg total) by mouth every 6 (six) hours as needed. 09/20/18   Fayrene Helper, PA-C  omeprazole (PRILOSEC) 40 MG capsule Take 40 mg by mouth daily.    [provider]  omeprazole (PRILOSEC) 40 MG capsule TAKE 1 CAPSULE BY MOUTH ONCE DAILY 07/23/20 07/23/21  Rolm Gala, NP  ondansetron (ZOFRAN ODT) 8 MG disintegrating tablet Take 1 tablet (8 mg total) by mouth every 8 (eight) hours as needed for nausea or vomiting. 08/08/20   Linwood Dibbles, MD   ondansetron (ZOFRAN) 4 MG tablet Take 4 mg by mouth every 8 (eight) hours as needed for nausea or vomiting.    [provider]  oxyCODONE-acetaminophen (PERCOCET) 10-325 MG tablet Take 1 tablet by mouth every 6 (six) hours as needed for pain.    [provider]  pramipexole (MIRAPEX) 0.25 MG tablet Take 0.25 mg by mouth 2 (two) times daily.  09/22/16   [provider]  pramipexole (MIRAPEX) 0.75 MG tablet TAKE 1 OR 2 TABLETS BY MOUTH 2 TO 3 HOURS BEFORE BEDTIME 07/23/20 08/27/22  Rolm Gala, NP  rizatriptan (MAXALT) 10 MG tablet Take 10 mg by mouth as needed for migraine. May repeat in 2 hours if needed    [provider]  tamsulosin (FLOMAX) 0.4 MG CAPS capsule Take 0.4 mg by mouth.    [provider]  topiramate (TOPAMAX) 200 MG tablet Take 200 mg by mouth daily.     [provider]      Allergies    Gadolinium derivatives, Shellfish allergy, Dilaudid [hydromorphone hcl], Phenergan [promethazine hcl], and Reglan [metoclopramide]    Review of Systems   Review of Systems  Cardiovascular:  Positive for chest pain.    Physical Exam Updated Vital Signs BP (!) 133/92   Pulse 86   Temp 98.6 F (37 C) (Oral)   Resp 14   Ht 5\' 4"  (1.626  m)   Wt 63.5 kg   SpO2 100%   BMI 24.03 kg/m  Physical Exam Vitals and nursing note reviewed.  Constitutional:      General: She is not in acute distress.    Appearance: She is well-developed. She is not diaphoretic.  HENT:     Head: Normocephalic and atraumatic.  Eyes:     Pupils: Pupils are equal, round, and reactive to light.  Cardiovascular:     Rate and Rhythm: Tachycardia present. Rhythm irregular.     Heart sounds: No murmur heard.    No friction rub. No gallop.  Pulmonary:     Effort: Pulmonary effort is normal.     Breath sounds: No wheezing or rales.  Abdominal:     General: There is no distension.     Palpations: Abdomen is soft.     Tenderness: There is no abdominal tenderness.   Musculoskeletal:        General: No tenderness.     Cervical back: Normal range of motion and neck supple.  Skin:    General: Skin is warm and dry.  Neurological:     Mental Status: She is alert and oriented to person, place, and time.  Psychiatric:        Behavior: Behavior normal.     ED Results / Procedures / Treatments   Labs (all labs ordered are listed, but only abnormal results are displayed) Labs Reviewed  D-DIMER, QUANTITATIVE - Abnormal; Notable for the following components:      Result Value   D-Dimer, Quant 1.38 (*)    All other components within normal limits  BASIC METABOLIC PANEL  CBC  MAGNESIUM  TROPONIN I (HIGH SENSITIVITY)    EKG EKG Interpretation Date/Time:  Tuesday April 05 2023 11:18:11 EDT Ventricular Rate:  95 PR Interval:    QRS Duration:  88 QT Interval:  332 QTC Calculation: 417 R Axis:   72  Text Interpretation: Atrial fibrillation Abnormal ECG Otherwise no significant change Confirmed by Melene Plan [phone]) on 04/05/2023 11:44:02 AM  Radiology CT Angio Chest PE W and/or Wo Contrast  Result Date: 04/05/2023 CLINICAL DATA:  Pulmonary embolism (PE) suspected, low to intermediate prob, positive D-dimer EXAM: CT ANGIOGRAPHY CHEST WITH CONTRAST TECHNIQUE: Multidetector CT imaging of the chest was performed using the standard protocol during bolus administration of intravenous contrast. Multiplanar CT image reconstructions and MIPs were obtained to evaluate the vascular anatomy. RADIATION DOSE REDUCTION: This exam was performed according to the departmental dose-optimization program which includes automated exposure control, adjustment of the mA and/or kV according to patient size and/or use of iterative reconstruction technique. CONTRAST:  80mL OMNIPAQUE IOHEXOL 350 MG/ML SOLN COMPARISON:  CTA Chest 07/02/17 FINDINGS: Cardiovascular: Satisfactory opacification of the pulmonary arteries to the segmental level. No evidence of pulmonary embolism. Normal  heart size. No pericardial effusion. Redemonstrated enlarged caliber of the main pulmonary artery which measures up to 3.8 cm, increased from prior exam. Mediastinum/Nodes: No enlarged mediastinal, hilar, or axillary lymph nodes. Thyroid gland, trachea, and esophagus demonstrate no significant findings. Lungs/Pleura: Lungs are clear. No pleural effusion or pneumothorax. Upper Abdomen: No acute abnormality. Musculoskeletal: No chest wall abnormality. No acute or significant osseous findings. Redemonstrated calcified lesion along the inferior aspect of the right breast. This was previously assessed on 12/11/2021 mammogram. Review of the MIP images confirms the above findings. IMPRESSION: 1. No evidence of pulmonary embolism. 2. Enlarged caliber of the main pulmonary artery which measures up to 3.8 cm, increased from prior exam. This  can be seen in the setting of pulmonary arterial hypertension. Correlate with echocardiography. Electronically Signed   By: Lorenza Cambridge M.D.   On: 04/05/2023 14:05   DG Chest Portable 1 View  Result Date: 04/05/2023 CLINICAL DATA:  Chest pain EXAM: PORTABLE CHEST 1 VIEW COMPARISON:  07/02/2017 FINDINGS: Transverse diameter of heart is increased. There are no signs of pulmonary edema or focal pulmonary consolidation. There is no pleural effusion or pneumothorax. IMPRESSION: Cardiomegaly. There are no signs of pulmonary edema or focal pulmonary consolidation. Electronically Signed   By: Ernie Avena M.D.   On: 04/05/2023 12:46    Procedures Procedures    Medications Ordered in ED Medications  sodium chloride 0.9 % bolus 1,000 mL (0 mLs Intravenous Stopped 04/05/23 1345)  iohexol (OMNIPAQUE) 350 MG/ML injection 100 mL (80 mLs Intravenous Contrast Given 04/05/23 1300)    ED Course/ Medical Decision Making/ A&P                             Medical Decision Making Amount and/or Complexity of Data Reviewed Labs: ordered. Radiology: ordered.  Risk Prescription drug  management.   52 yo F with a chief complaint of chest pressure.  Going on since yesterday.  She is newly in atrial fibrillation it appears.  Not sure if this occurred yesterday and she is symptomatic with it or if she has been having issues with it as she reports has been quite tachycardic and her endocrinologist has been titrating her medications.  Will obtain a laboratory evaluation to assess for electrolyte derangement.  Will screen for ACS or PE.  D-dimer was elevated.  CT angiogram of the chest was ordered.  This is negative for pulmonary embolism or occult pneumonia.  Troponin negative.  No significant electrolyte abnormalities.  Mag levels normal.  No acute anemia.  I discussed results with the patient.  Will start on Eliquis.  Have her follow-up with A-fib clinic.  CHA2DS2/VAS Stroke Risk Points  Current as of 4 minutes ago     1 >= 2 Points: High Risk  1 to 1.99 Points: Medium Risk  0 Points: Low Risk    No Change      Details    This score determines the patient's risk of having a stroke if the  patient has atrial fibrillation.       Points Metrics  0 Has Congestive Heart Failure:  No    Current as of 4 minutes ago  0 Has Vascular Disease:  No    Current as of 4 minutes ago  0 Has Hypertension:  No    Current as of 4 minutes ago  0 Age:  14    Current as of 4 minutes ago  0 Has Diabetes:  No    Current as of 4 minutes ago  0 Had Stroke:  No  Had TIA:  No  Had Thromboembolism:  No    Current as of 4 minutes ago  1 Female:  Yes    Current as of 4 minutes ago           3:11 PM:  I have discussed the diagnosis/risks/treatment options with the patient and family.  Evaluation and diagnostic testing in the emergency department does not suggest an emergent condition requiring admission or immediate intervention beyond what has been performed at this time.  They will follow up with Afib clinic, PCP, endocrine. We also discussed returning to the ED immediately if new  or  worsening sx occur. We discussed the sx which are most concerning (e.g., sudden worsening pain, fever, inability to tolerate by mouth) that necessitate immediate return. Medications administered to the patient during their visit and any new prescriptions provided to the patient are listed below.  Medications given during this visit Medications  sodium chloride 0.9 % bolus 1,000 mL (0 mLs Intravenous Stopped 04/05/23 1345)  iohexol (OMNIPAQUE) 350 MG/ML injection 100 mL (80 mLs Intravenous Contrast Given 04/05/23 1300)     The patient appears reasonably screen and/or stabilized for discharge and I doubt any other medical condition or other [facility] requiring further screening, evaluation, or treatment in the ED at this time prior to discharge.          Final Clinical Impression(s) / ED Diagnoses Final diagnoses:  Paroxysmal atrial fibrillation (HCC)    Rx / DC Orders ED Discharge Orders          Ordered    apixaban (ELIQUIS) 5 MG TABS tablet  2 times daily        04/05/23 1424              Melene Plan, DO 04/05/23 1511

## 2023-04-11 ENCOUNTER — Other Ambulatory Visit: Payer: Self-pay

## 2023-04-11 ENCOUNTER — Encounter (HOSPITAL_BASED_OUTPATIENT_CLINIC_OR_DEPARTMENT_OTHER): Payer: Self-pay | Admitting: Emergency Medicine

## 2023-04-11 ENCOUNTER — Emergency Department (HOSPITAL_BASED_OUTPATIENT_CLINIC_OR_DEPARTMENT_OTHER)
Admission: EM | Admit: 2023-04-11 | Discharge: 2023-04-11 | Disposition: A | Discharge status: Home / Self Care | Payer: Self-pay | Attending: Emergency Medicine | Admitting: Emergency Medicine

## 2023-04-11 ENCOUNTER — Emergency Department (HOSPITAL_BASED_OUTPATIENT_CLINIC_OR_DEPARTMENT_OTHER): Payer: Self-pay | Admitting: Radiology

## 2023-04-11 DIAGNOSIS — M25551 Pain in right hip: Secondary | ICD-10-CM | POA: Insufficient documentation

## 2023-04-11 DIAGNOSIS — Z7901 Long term (current) use of anticoagulants: Secondary | ICD-10-CM | POA: Insufficient documentation

## 2023-04-11 DIAGNOSIS — R072 Precordial pain: Secondary | ICD-10-CM

## 2023-04-11 DIAGNOSIS — I480 Paroxysmal atrial fibrillation: Secondary | ICD-10-CM | POA: Insufficient documentation

## 2023-04-11 LAB — CBC
HCT: 40.1 % (ref 36.0–46.0)
Hemoglobin: 13.3 g/dL (ref 12.0–15.0)
MCH: 29.1 pg (ref 26.0–34.0)
MCHC: 33.2 g/dL (ref 30.0–36.0)
MCV: 87.7 fL (ref 80.0–100.0)
Platelets: 294 10*3/uL (ref 150–400)
RBC: 4.57 MIL/uL (ref 3.87–5.11)
RDW: 14.3 % (ref 11.5–15.5)
WBC: 7.3 10*3/uL (ref 4.0–10.5)
nRBC: 0 % (ref 0.0–0.2)

## 2023-04-11 LAB — BASIC METABOLIC PANEL
Anion gap: 8 (ref 5–15)
BUN: 10 mg/dL (ref 6–20)
CO2: 25 mmol/L (ref 22–32)
Calcium: 8.7 mg/dL — ABNORMAL LOW (ref 8.9–10.3)
Chloride: 106 mmol/L (ref 98–111)
Creatinine, Ser: 0.7 mg/dL (ref 0.44–1.00)
GFR, Estimated: >60 mL/min (ref >60)
Glucose, Bld: 91 mg/dL (ref 70–99)
Potassium: 4.4 mmol/L (ref 3.5–5.1)
Sodium: 139 mmol/L (ref 135–145)

## 2023-04-11 LAB — TROPONIN I (HIGH SENSITIVITY): Troponin I (High Sensitivity): 3 ng/L (ref <18)

## 2023-04-11 NOTE — ED Triage Notes (Signed)
Pt arrives to ED with c/o on-going chest pain x6 days and intermittent right groin/leg pain and right shoulder pain since yesterday.

## 2023-04-11 NOTE — ED Provider Notes (Signed)
[place] EMERGENCY DEPARTMENT AT [facility] Provider Note   CSN: [id number] Arrival date & time: 04/11/23  2952     History  Chief Complaint  Patient presents with   Chest Pain   Leg Pain    Kolina Kube is a 52 y.o. female.  Pt c/o pain to mid chest in past day, at rest, dull, non radiating, not pleuritic. Was recently in ED w chest pain and noted to be in afib. Is on eliquis, compliant w therapy. Denies chest wall injury or strain. No associated, sob, nv or diaphoresis. No heartburn. No cough or uri symptoms. No fever or chills. States had transient sharp pain to right shoulder and right hip area. No arm/leg swelling. No hx cad, or fam hx premature cad. No hx dvt or pe. No leg/calf swelling.   The history is provided by the patient and medical records.  Chest Pain Associated symptoms: no abdominal pain, no back pain, no cough, no fever, no headache, no nausea, no shortness of breath and no vomiting   Leg Pain Associated symptoms: no back pain, no fever and no neck pain        Home Medications Prior to Admission medications   Medication Sig Start Date End Date Taking? Authorizing Provider  apixaban (ELIQUIS) 5 MG TABS tablet Take 1 tablet (5 mg total) by mouth 2 (two) times daily. 04/05/23 05/05/23  Melene Plan, DO  cyclobenzaprine (FLEXERIL) 10 MG tablet Take 1 tablet (10 mg total) by mouth 2 (two) times daily as needed for muscle spasms. 09/20/18   Fayrene Helper, PA-C  ergocalciferol (VITAMIN D2) 50000 units capsule Take 50,000 Units by mouth once a week.    [provider]  escitalopram (LEXAPRO) 10 MG tablet TAKE 1 TABLET BY MOUTH ONCE A DAY 07/23/20 07/23/21  Rolm Gala, NP  escitalopram (LEXAPRO) 20 MG tablet Take 20 mg by mouth at bedtime. 05/11/17   [provider]  gabapentin (NEURONTIN) 300 MG capsule Take 300 mg by mouth at bedtime.     [provider]  ibuprofen (ADVIL,MOTRIN) 600 MG tablet Take 1 tablet (600 mg total) by mouth  every 6 (six) hours as needed. 09/20/18   Fayrene Helper, PA-C  omeprazole (PRILOSEC) 40 MG capsule Take 40 mg by mouth daily.    [provider]  omeprazole (PRILOSEC) 40 MG capsule TAKE 1 CAPSULE BY MOUTH ONCE DAILY 07/23/20 07/23/21  Rolm Gala, NP  ondansetron (ZOFRAN ODT) 8 MG disintegrating tablet Take 1 tablet (8 mg total) by mouth every 8 (eight) hours as needed for nausea or vomiting. 08/08/20   Linwood Dibbles, MD  ondansetron (ZOFRAN) 4 MG tablet Take 4 mg by mouth every 8 (eight) hours as needed for nausea or vomiting.    [provider]  oxyCODONE-acetaminophen (PERCOCET) 10-325 MG tablet Take 1 tablet by mouth every 6 (six) hours as needed for pain.    [provider]  pramipexole (MIRAPEX) 0.25 MG tablet Take 0.25 mg by mouth 2 (two) times daily.  09/22/16   [provider]  pramipexole (MIRAPEX) 0.75 MG tablet TAKE 1 OR 2 TABLETS BY MOUTH 2 TO 3 HOURS BEFORE BEDTIME 07/23/20 08/27/22  Rolm Gala, NP  rizatriptan (MAXALT) 10 MG tablet Take 10 mg by mouth as needed for migraine. May repeat in 2 hours if needed    [provider]  tamsulosin (FLOMAX) 0.4 MG CAPS capsule Take 0.4 mg by mouth.    [provider]  topiramate (TOPAMAX) 200 MG tablet Take 200  mg by mouth daily.     [provider]      Allergies    Gadolinium derivatives, Shellfish allergy, Dilaudid [hydromorphone hcl], Phenergan [promethazine hcl], and Reglan [metoclopramide]    Review of Systems   Review of Systems  Constitutional:  Negative for chills and fever.  HENT:  Negative for sore throat.   Eyes:  Negative for redness.  Respiratory:  Negative for cough and shortness of breath.   Cardiovascular:  Positive for chest pain. Negative for leg swelling.  Gastrointestinal:  Negative for abdominal pain, nausea and vomiting.  Genitourinary:  Negative for flank pain.  Musculoskeletal:  Negative for back pain and neck pain.  Skin:  Negative for rash.  Neurological:   Negative for headaches.    Physical Exam Updated Vital Signs BP 119/85 (BP Location: Right Arm)   Pulse (!) 57   Temp 98 F (36.7 C) (Oral)   Resp 19   SpO2 98%  Physical Exam Vitals and nursing note reviewed.  Constitutional:      Appearance: Normal appearance. She is well-developed.  HENT:     Head: Atraumatic.     Nose: Nose normal.     Mouth/Throat:     Mouth: Mucous membranes are moist.  Eyes:     General: No scleral icterus.    Conjunctiva/sclera: Conjunctivae normal.  Neck:     Trachea: No tracheal deviation.  Cardiovascular:     Rate and Rhythm: Normal rate. Rhythm irregular.     Pulses: Normal pulses.     Heart sounds: Normal heart sounds. No murmur heard.    No friction rub. No gallop.  Pulmonary:     Effort: Pulmonary effort is normal. No respiratory distress.     Breath sounds: Normal breath sounds.  Chest:     Chest wall: No tenderness.  Abdominal:     General: There is no distension.     Palpations: Abdomen is soft.     Tenderness: There is no abdominal tenderness.  Musculoskeletal:        General: No swelling or tenderness.     Cervical back: Normal range of motion and neck supple. No rigidity. No muscular tenderness.     Right lower leg: No edema.     Left lower leg: No edema.     Comments: Good rom at right hip and shoulder without pain. No extremity swelling. Extremities of normal color and warmth, with intact pulses.   Skin:    General: Skin is warm and dry.     Findings: No rash.  Neurological:     Mental Status: She is alert.     Comments: Alert, speech normal.   Psychiatric:        Mood and Affect: Mood normal.     ED Results / Procedures / Treatments   Labs (all labs ordered are listed, but only abnormal results are displayed) Results for orders placed or performed during the hospital encounter of 04/11/23  Basic metabolic panel  Result Value Ref Range   Sodium 139 135 - 145 mmol/L   Potassium 4.4 3.5 - 5.1 mmol/L   Chloride 106  98 - 111 mmol/L   CO2 25 22 - 32 mmol/L   Glucose, Bld 91 70 - 99 mg/dL   BUN 10 6 - 20 mg/dL   Creatinine, Ser 1.61 0.44 - 1.00 mg/dL   Calcium 8.7 (L) 8.9 - 10.3 mg/dL   GFR, Estimated >09 >60 mL/min   Anion gap 8 5 - 15  CBC  Result Value Ref Range   WBC 7.3 4.0 - 10.5 K/uL   RBC 4.57 3.87 - 5.11 MIL/uL   Hemoglobin 13.3 12.0 - 15.0 g/dL   HCT 16.1 09.6 - 04.5 %   MCV 87.7 80.0 - 100.0 fL   MCH 29.1 26.0 - 34.0 pg   MCHC 33.2 30.0 - 36.0 g/dL   RDW 40.9 81.1 - 91.4 %   Platelets 294 150 - 400 K/uL   nRBC 0.0 0.0 - 0.2 %  Troponin I (High Sensitivity)  Result Value Ref Range   Troponin I (High Sensitivity) 3 <18 ng/L   DG Chest 2 View  Result Date: 04/11/2023 CLINICAL DATA:  Chest pain EXAM: CHEST - 2 VIEW COMPARISON:  X-ray and CT angiogram 04/05/2023. FINDINGS: No consolidation, pneumothorax or effusion. No edema. Normal cardiopericardial silhouette. Tortuous aorta. Overlapping cardiac leads. Calcification overlying the lower right thorax was felt to be along the breast on the prior x-ray. Surgical clips in the right upper quadrant of the abdomen. IMPRESSION: No acute cardiopulmonary disease. Electronically Signed   By: Karen Kays M.D.   On: 04/11/2023 10:54   CT Angio Chest PE W and/or Wo Contrast  Result Date: 04/05/2023 CLINICAL DATA:  Pulmonary embolism (PE) suspected, low to intermediate prob, positive D-dimer EXAM: CT ANGIOGRAPHY CHEST WITH CONTRAST TECHNIQUE: Multidetector CT imaging of the chest was performed using the standard protocol during bolus administration of intravenous contrast. Multiplanar CT image reconstructions and MIPs were obtained to evaluate the vascular anatomy. RADIATION DOSE REDUCTION: This exam was performed according to the departmental dose-optimization program which includes automated exposure control, adjustment of the mA and/or kV according to patient size and/or use of iterative reconstruction technique. CONTRAST:  80mL OMNIPAQUE IOHEXOL 350  MG/ML SOLN COMPARISON:  CTA Chest 07/02/17 FINDINGS: Cardiovascular: Satisfactory opacification of the pulmonary arteries to the segmental level. No evidence of pulmonary embolism. Normal heart size. No pericardial effusion. Redemonstrated enlarged caliber of the main pulmonary artery which measures up to 3.8 cm, increased from prior exam. Mediastinum/Nodes: No enlarged mediastinal, hilar, or axillary lymph nodes. Thyroid gland, trachea, and esophagus demonstrate no significant findings. Lungs/Pleura: Lungs are clear. No pleural effusion or pneumothorax. Upper Abdomen: No acute abnormality. Musculoskeletal: No chest wall abnormality. No acute or significant osseous findings. Redemonstrated calcified lesion along the inferior aspect of the right breast. This was previously assessed on 12/11/2021 mammogram. Review of the MIP images confirms the above findings. IMPRESSION: 1. No evidence of pulmonary embolism. 2. Enlarged caliber of the main pulmonary artery which measures up to 3.8 cm, increased from prior exam. This can be seen in the setting of pulmonary arterial hypertension. Correlate with echocardiography. Electronically Signed   By: Lorenza Cambridge M.D.   On: 04/05/2023 14:05   DG Chest Portable 1 View  Result Date: 04/05/2023 CLINICAL DATA:  Chest pain EXAM: PORTABLE CHEST 1 VIEW COMPARISON:  07/02/2017 FINDINGS: Transverse diameter of heart is increased. There are no signs of pulmonary edema or focal pulmonary consolidation. There is no pleural effusion or pneumothorax. IMPRESSION: Cardiomegaly. There are no signs of pulmonary edema or focal pulmonary consolidation. Electronically Signed   By: Ernie Avena M.D.   On: 04/05/2023 12:46     EKG EKG Interpretation Date/Time:  Monday April 11 2023 10:24:15 EDT Ventricular Rate:  75 PR Interval:    QRS Duration:  87 QT Interval:  400 QTC Calculation: 447 R Axis:   61  Text Interpretation: Atrial fibrillation No significant change since last  tracing Confirmed by Denton Lank,  Caryn Bee ([id number]) on 04/11/2023 10:35:38 AM  Radiology DG Chest 2 View  Result Date: 04/11/2023 CLINICAL DATA:  Chest pain EXAM: CHEST - 2 VIEW COMPARISON:  X-ray and CT angiogram 04/05/2023. FINDINGS: No consolidation, pneumothorax or effusion. No edema. Normal cardiopericardial silhouette. Tortuous aorta. Overlapping cardiac leads. Calcification overlying the lower right thorax was felt to be along the breast on the prior x-ray. Surgical clips in the right upper quadrant of the abdomen. IMPRESSION: No acute cardiopulmonary disease. Electronically Signed   By: Karen Kays M.D.   On: 04/11/2023 10:54    Procedures Procedures    Medications Ordered in ED Medications - No data to display  ED Course/ Medical Decision Making/ A&P                             Medical Decision Making Problems Addressed: Paroxysmal atrial fibrillation [facility]): acute illness or injury with systemic symptoms that poses a threat to life or bodily functions Precordial chest pain: acute illness or injury with systemic symptoms that poses a threat to life or bodily functions  Amount and/or Complexity of Data Reviewed External Data Reviewed: notes. Labs: ordered. Decision-making details documented in ED Course. Radiology: ordered and independent interpretation performed. Decision-making details documented in ED Course. ECG/medicine tests: ordered and independent interpretation performed. Decision-making details documented in ED Course.  Risk Decision regarding hospitalization.   Iv ns. Continuous pulse ox and cardiac monitoring. Labs ordered/sent. Imaging ordered.   Differential diagnosis includes ACS, msk cp, gi cp, etc. Dispo decision including potential need for admission considered - will get labs and imaging and reassess.   Reviewed nursing notes and prior charts for additional history. External reports reviewed.   Cardiac monitor: afib, rate 74  Labs reviewed/interpreted by me -  trop 3, after symptoms present for past day, felt not c/w acs. Wbc and hgb normal. Chem normal.   Xrays reviewed/interpreted by me - no pna.  Vitals normal. No acute distress. Pulse ox 99%. Pt currently appears stable for d/c.   Rec close pcp and cardiology f/u - pt indicates has afib/cardiology f/u already arranged for this week.   Return precautions provided.          Final Clinical Impression(s) / ED Diagnoses Final diagnoses:  Precordial chest pain  Paroxysmal atrial fibrillation [facility])    Rx / DC Orders ED Discharge Orders     None         Cathren Laine, MD 04/11/23 1135

## 2023-04-11 NOTE — Discharge Instructions (Addendum)
It was our pleasure to provide your ER care today - we hope that you feel better.  Continue eliquis. Follow up as arranged with cardiologist/afib clinic later this week.   Return to ER if worse, new symptoms, recurrent/persistent chest pain, increased trouble breathing, or other concern.

## 2023-04-11 NOTE — ED Notes (Signed)
 RN reviewed discharge instructions with pt. Pt verbalized understanding and had no further questions. VSS upon discharge.  

## 2023-04-15 ENCOUNTER — Ambulatory Visit (HOSPITAL_COMMUNITY)
Admission: RE | Admit: 2023-04-15 | Discharge: 2023-04-15 | Disposition: A | Discharge status: Home / Self Care | Payer: Self-pay | Source: Ambulatory Visit | Attending: Physician Assistant | Admitting: Physician Assistant

## 2023-04-15 ENCOUNTER — Encounter (HOSPITAL_COMMUNITY): Payer: Self-pay | Admitting: Physician Assistant

## 2023-04-15 VITALS — BP 118/72 | HR 66 | Ht 64.0 in | Wt 162.0 lb

## 2023-04-15 DIAGNOSIS — E0590 Thyrotoxicosis, unspecified without thyrotoxic crisis or storm: Secondary | ICD-10-CM | POA: Insufficient documentation

## 2023-04-15 DIAGNOSIS — I4819 Other persistent atrial fibrillation: Secondary | ICD-10-CM | POA: Insufficient documentation

## 2023-04-15 DIAGNOSIS — Z7901 Long term (current) use of anticoagulants: Secondary | ICD-10-CM | POA: Insufficient documentation

## 2023-04-15 HISTORY — DX: Other persistent atrial fibrillation: I48.19

## 2023-04-15 NOTE — Progress Notes (Signed)
Primary Care Physician: Rolm Gala, NP Primary Cardiologist: None Electrophysiologist: None  Referring Physician: [facility]   Carmen Gutierrez is a 52 y.o. female with a history of breast cancer, hyperthyroidism, restless leg syndrome, GERD, atrial fibrillation who presents for consultation in the [facility].  The patient was initially diagnosed with atrial fibrillation 04/05/23 after presenting to the ED with symptoms of chest discomfort. Patient was started on Eliquis for a CHADS2VASC score of 1. She presented again to the ED 04/11/23 with chest and leg pain. She was still in rate controlled afib but otherwise workup was unremarkable.   Today, patient remains in rate controlled afib. She does still occasionally feel an unsettling feeling in her chest but this is much improved with rate control. No bleeding issues on anticoagulation. She denies significant snoring or alcohol use. She was diagnosed with hyperthyroidism about one month ago and has been started on methimazole. She is without insurance presently until 05/30/23.   Today, she denies symptoms of shortness of breath, orthopnea, PND, lower extremity edema, dizziness, presyncope, syncope, snoring, daytime somnolence, bleeding, or neurologic sequela. The patient is tolerating medications without difficulties and is otherwise without complaint today.    Atrial Fibrillation Risk Factors:  she does not have symptoms or diagnosis of sleep apnea. she does not have a history of rheumatic fever. she does not have a history of alcohol use. The patient does not have a history of early familial atrial fibrillation or other arrhythmias.  Atrial Fibrillation Management history:  Previous antiarrhythmic drugs: none Previous cardioversions: none Previous ablations: none Anticoagulation history: Eliquis  ROS- All systems are reviewed and negative except as per the HPI above.   Physical Exam: BP 118/72    Pulse 66   Ht 5\' 4"  (1.626 m)   Wt 73.5 kg   BMI 27.81 kg/m   GEN: Well nourished, well developed in no acute distress NECK: No JVD; No carotid bruits, goiter noted  CARDIAC: Irregularly irregular rate and rhythm, no murmurs, rubs, gallops RESPIRATORY:  Clear to auscultation without rales, wheezing or rhonchi  ABDOMEN: Soft, non-tender, non-distended EXTREMITIES:  No edema; No deformity   Wt Readings from Last 3 Encounters:  04/15/23 73.5 kg  04/05/23 63.5 kg  08/27/22 63.5 kg     EKG today demonstrates  Afib Vent. rate 66 BPM PR interval * ms QRS duration 86 ms QT/QTcB 396/415 ms  Echo 10/15/16 demonstrated  - Left ventricle: The cavity size was normal. Systolic function was    normal. The estimated ejection fraction was in the range of 55%    to 60%. Wall motion was normal; there were no regional wall    motion abnormalities. Left ventricular diastolic function    parameters were normal.  - Aortic valve: Valve area (VTI): 2.28 cm^2.  - Atrial septum: No defect or patent foramen ovale was identified.    CHA2DS2-VASc Score = 1  The patient's score is based upon: CHF History: 0 HTN History: 0 Diabetes History: 0 Stroke History: 0 Vascular Disease History: 0 Age Score: 0 Gender Score: 1       ASSESSMENT AND PLAN: Persistent Atrial Fibrillation (ICD10:  I48.19) The patient's CHA2DS2-VASc score is 1, indicating a 0.6% annual risk of stroke.   General education about afib provided and questions answered. We also discussed her stroke risk and the risks and benefits of anticoagulation. Anticipate that anticoagulation will be short term with her low CV score. Suspect that her afib is secondary to  her hyperthyroidism.  We discussed rhythm control options. Will arrange for DCCV once her insurance goes into effect early September. This will also allow time for her thyroid function to normalize. Fortunately, she is rate controlled and only mildly symptomatic at this time.   Apple watch for home monitoring.  Check echocardiogram once she has insurance.  Continue Eliquis 5 mg BID, samples given today.  Hyperthyroidism On methimazole She plans to establish care with endocrinology    Follow up in the AF clinic in 6 weeks.    Jorja Loa PA-C Afib Clinic [facility] [address] [place], [place] [address] [phone]

## 2023-04-18 ENCOUNTER — Telehealth (HOSPITAL_COMMUNITY): Payer: Self-pay

## 2023-04-18 ENCOUNTER — Other Ambulatory Visit (HOSPITAL_COMMUNITY): Payer: Self-pay

## 2023-04-18 ENCOUNTER — Other Ambulatory Visit (HOSPITAL_BASED_OUTPATIENT_CLINIC_OR_DEPARTMENT_OTHER): Payer: Self-pay

## 2023-04-18 MED ORDER — METOPROLOL TARTRATE 50 MG PO TABS
50.0000 mg | ORAL_TABLET | Freq: Two times a day (BID) | ORAL | 3 refills | Status: DC
Start: 2023-04-18 — End: 2023-05-31
  Filled 2023-04-18 – 2023-04-29 (×2): qty 60, 30d supply, fill #0
  Filled 2023-05-16: qty 60, 30d supply, fill #1

## 2023-04-18 MED ORDER — METHIMAZOLE 10 MG PO TABS
10.0000 mg | ORAL_TABLET | Freq: Two times a day (BID) | ORAL | 0 refills | Status: DC
  Filled 2023-04-18: qty 60, 30d supply, fill #0
  Filled 2023-04-21: qty 16, 8d supply, fill #0
  Filled 2023-04-29: qty 44, 22d supply, fill #1

## 2023-04-18 NOTE — Telephone Encounter (Signed)
Patient called in wanting Tapazole medication sent in to her pharmacy.  Sent in Tapazole 10 mg, 30 day supply with no refills. Left message for patient to let her know I sent in medication to pharmacy.

## 2023-04-18 NOTE — Telephone Encounter (Signed)
Patient called in needing refills on her Metoprolol Tartrate. Sent in Metoprolol Tartrate 50 mg BID to her pharmacy, 60 tablets with 3 Refills. Left message to notify patient.

## 2023-04-21 ENCOUNTER — Other Ambulatory Visit (HOSPITAL_BASED_OUTPATIENT_CLINIC_OR_DEPARTMENT_OTHER): Payer: Self-pay

## 2023-04-25 ENCOUNTER — Other Ambulatory Visit (HOSPITAL_BASED_OUTPATIENT_CLINIC_OR_DEPARTMENT_OTHER): Payer: Self-pay

## 2023-04-29 ENCOUNTER — Other Ambulatory Visit (HOSPITAL_BASED_OUTPATIENT_CLINIC_OR_DEPARTMENT_OTHER): Payer: Self-pay

## 2023-04-29 ENCOUNTER — Other Ambulatory Visit: Payer: Self-pay

## 2023-05-10 ENCOUNTER — Ambulatory Visit: Payer: Self-pay | Admitting: Internal Medicine

## 2023-05-13 ENCOUNTER — Other Ambulatory Visit (HOSPITAL_BASED_OUTPATIENT_CLINIC_OR_DEPARTMENT_OTHER): Payer: Self-pay

## 2023-05-13 ENCOUNTER — Other Ambulatory Visit (HOSPITAL_COMMUNITY): Payer: Self-pay

## 2023-05-13 MED ORDER — PRAMIPEXOLE DIHYDROCHLORIDE 0.75 MG PO TABS
0.7500 mg | ORAL_TABLET | Freq: Every evening | ORAL | 2 refills | Status: DC
  Filled 2023-05-13: qty 30, 30d supply, fill #0
  Filled 2023-06-09: qty 30, 30d supply, fill #1

## 2023-05-13 MED ORDER — APIXABAN 5 MG PO TABS: 5.0000 mg | ORAL_TABLET | Freq: Two times a day (BID) | ORAL | Status: DC

## 2023-05-16 ENCOUNTER — Other Ambulatory Visit (HOSPITAL_COMMUNITY): Payer: Self-pay | Admitting: Physician Assistant

## 2023-05-16 ENCOUNTER — Other Ambulatory Visit (HOSPITAL_BASED_OUTPATIENT_CLINIC_OR_DEPARTMENT_OTHER): Payer: Self-pay

## 2023-05-16 ENCOUNTER — Other Ambulatory Visit: Payer: Self-pay

## 2023-05-17 ENCOUNTER — Other Ambulatory Visit (HOSPITAL_BASED_OUTPATIENT_CLINIC_OR_DEPARTMENT_OTHER): Payer: Self-pay

## 2023-05-17 ENCOUNTER — Ambulatory Visit: Payer: Self-pay | Admitting: Internal Medicine

## 2023-05-17 MED ORDER — METHIMAZOLE 10 MG PO TABS
10.0000 mg | ORAL_TABLET | Freq: Two times a day (BID) | ORAL | 0 refills | Status: DC
  Filled 2023-05-17: qty 60, 30d supply, fill #0

## 2023-05-19 ENCOUNTER — Other Ambulatory Visit (HOSPITAL_BASED_OUTPATIENT_CLINIC_OR_DEPARTMENT_OTHER): Payer: Self-pay

## 2023-05-31 ENCOUNTER — Encounter: Payer: Self-pay | Admitting: Internal Medicine

## 2023-05-31 ENCOUNTER — Ambulatory Visit (HOSPITAL_COMMUNITY)
Admission: RE | Admit: 2023-05-31 | Discharge: 2023-05-31 | Disposition: A | Discharge status: Home / Self Care | Payer: BC Managed Care – PPO | Source: Ambulatory Visit | Attending: Physician Assistant | Admitting: Physician Assistant

## 2023-05-31 ENCOUNTER — Other Ambulatory Visit (HOSPITAL_BASED_OUTPATIENT_CLINIC_OR_DEPARTMENT_OTHER): Payer: Self-pay

## 2023-05-31 ENCOUNTER — Ambulatory Visit (INDEPENDENT_AMBULATORY_CARE_PROVIDER_SITE_OTHER): Payer: BC Managed Care – PPO | Admitting: Internal Medicine

## 2023-05-31 VITALS — BP 126/80 | HR 56 | Temp 98.1°F | Ht 64.0 in | Wt 178.0 lb

## 2023-05-31 VITALS — BP 160/82 | HR 43 | Ht 64.0 in | Wt 178.2 lb

## 2023-05-31 DIAGNOSIS — Z7901 Long term (current) use of anticoagulants: Secondary | ICD-10-CM | POA: Insufficient documentation

## 2023-05-31 DIAGNOSIS — I4819 Other persistent atrial fibrillation: Secondary | ICD-10-CM | POA: Insufficient documentation

## 2023-05-31 DIAGNOSIS — E0590 Thyrotoxicosis, unspecified without thyrotoxic crisis or storm: Secondary | ICD-10-CM | POA: Diagnosis not present

## 2023-05-31 DIAGNOSIS — Z79899 Other long term (current) drug therapy: Secondary | ICD-10-CM | POA: Insufficient documentation

## 2023-05-31 DIAGNOSIS — R001 Bradycardia, unspecified: Secondary | ICD-10-CM | POA: Insufficient documentation

## 2023-05-31 DIAGNOSIS — Z853 Personal history of malignant neoplasm of breast: Secondary | ICD-10-CM | POA: Diagnosis not present

## 2023-05-31 DIAGNOSIS — K219 Gastro-esophageal reflux disease without esophagitis: Secondary | ICD-10-CM | POA: Diagnosis not present

## 2023-05-31 DIAGNOSIS — G2581 Restless legs syndrome: Secondary | ICD-10-CM | POA: Insufficient documentation

## 2023-05-31 LAB — COMPREHENSIVE METABOLIC PANEL
ALT: 18 U/L (ref 0–35)
AST: 28 U/L (ref 0–37)
Albumin: 4.1 g/dL (ref 3.5–5.2)
Alkaline Phosphatase: 249 U/L — ABNORMAL HIGH (ref 39–117)
BUN: 10 mg/dL (ref 6–23)
CO2: 31 meq/L (ref 19–32)
Calcium: 8.8 mg/dL (ref 8.4–10.5)
Chloride: 103 meq/L (ref 96–112)
Creatinine, Ser: 0.64 mg/dL (ref 0.40–1.20)
GFR: 102.62 mL/min (ref >60.00)
Glucose, Bld: 86 mg/dL (ref 70–99)
Potassium: 3.9 meq/L (ref 3.5–5.1)
Sodium: 140 meq/L (ref 135–145)
Total Bilirubin: 0.6 mg/dL (ref 0.2–1.2)
Total Protein: 6.9 g/dL (ref 6.0–8.3)

## 2023-05-31 LAB — TSH: TSH: 26.13 u[IU]/mL — ABNORMAL HIGH (ref 0.35–5.50)

## 2023-05-31 LAB — T4, FREE: Free T4: 0.3 ng/dL — ABNORMAL LOW (ref 0.60–1.60)

## 2023-05-31 MED ORDER — APIXABAN 5 MG PO TABS
5.0000 mg | ORAL_TABLET | Freq: Two times a day (BID) | ORAL | 0 refills | Status: DC
  Filled 2023-05-31: qty 60, 30d supply, fill #0

## 2023-05-31 MED ORDER — METOPROLOL TARTRATE 25 MG PO TABS
25.0000 mg | ORAL_TABLET | Freq: Two times a day (BID) | ORAL | 0 refills | Status: DC
  Filled 2023-05-31: qty 60, 30d supply, fill #0

## 2023-05-31 NOTE — Progress Notes (Signed)
[facility] [address] [place] [address] Dept: [phone] Dept Fax: [phone]  New Patient Office Visit  Subjective:   Carmen Gutierrez 1971/01/24 05/31/2023  Chief Complaint  Patient presents with   Establish Care   Hyperthyroidism    HPI: Carmen Gutierrez presents today to establish care at [facility]. Introduced to Publishing rights manager role and practice setting.  All questions answered.  Concerns: See below   Luretha Peterkin is a 52 year old female who was recently diagnosed with hyperthyroidism.  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  Patient went to urgent care, who performed lab testing and found patient to be in hyperthyroid state.  Patient was placed on methimazole 10 mg twice daily, has been compliant with medication regimen.  She states the urgent care was supposed to place a endocrinology referral, but the referral fell through the cracks.  She currently does not have a scheduled appointment with endocrinology.  She states that over this past year she has noticed she was having tremors, excessive sweating, and weight loss of approximately 100 pounds.  Since starting medication, she has gained 30 pounds back.  She was also seen in the ER on 04/05/2023 for new onset paroxysmal atrial fibrillation related to hyperthyroid state.  Patient did have follow-up with cardiology today, who reduced her metoprolol from 50 mg twice daily to 25 mg twice daily.  She remains in normal sinus rhythm.  She is to continue her anticoagulation therapy and has a scheduled echo.  If echo is normal, patient will discontinue medication and anticoagulation.    The following portions of the patient's history were reviewed and updated as appropriate: past medical history, past surgical history, family history, social history, allergies,  medications, and problem list.   Patient Active Problem List   Diagnosis Date Noted   Persistent atrial fibrillation (HCC) 04/15/2023   Aphasia 10/14/2016   Syncope 10/14/2016   Migraines 10/14/2016   GERD (gastroesophageal reflux disease) 10/14/2016   Restless leg syndrome 10/14/2016   [place]    Past Medical History:  Diagnosis Date   Anxiety    Breast cancer (HCC) 2014   right breast   Cancer (HCC)    Depression    GERD (gastroesophageal reflux disease)    History of kidney stones    Migraines    Personal history of radiation therapy 2014   R   Restless leg syndrome    Past Surgical History:  Procedure Laterality Date   ABDOMINAL HYSTERECTOMY     BREAST BIOPSY     BREAST LUMPECTOMY Right 2014   BREAST SURGERY     c-sections x2     CHOLECYSTECTOMY     EXTRACORPOREAL SHOCK WAVE LITHOTRIPSY Left 03/30/2018   Procedure: LEFT EXTRACORPOREAL SHOCK WAVE LITHOTRIPSY (ESWL);  Surgeon: Malen Gauze, MD;  Location: [facility] ORS;  Service: Urology;  Laterality: Left;   HEMORROIDECTOMY     History reviewed. No pertinent family history.  Current Outpatient Medications:    apixaban (ELIQUIS) 5 MG TABS tablet, Take 1 tablet (5 mg total) by mouth 2 (two) times daily., Disp: 60 tablet, Rfl: 0   methimazole (TAPAZOLE) 10 MG tablet, Take 1 tablet (10 mg total) by mouth 2 (two) times daily., Disp: 60 tablet, Rfl: 0   metoprolol tartrate (LOPRESSOR) 25 MG tablet, Take 1 tablet (25 mg total) by mouth 2 (two) times daily., Disp:  60 tablet, Rfl: 0   omeprazole (PRILOSEC) 40 MG capsule, Take 40 mg by mouth daily., Disp: , Rfl:    ondansetron (ZOFRAN ODT) 8 MG disintegrating tablet, Take 1 tablet (8 mg total) by mouth every 8 (eight) hours as needed for nausea or vomiting., Disp: 12 tablet, Rfl: 0   pramipexole (MIRAPEX) 0.75 MG tablet, Take 1 tablet (0.75 mg total) by mouth every evening. (Patient taking differently: Take 0.75 mg by mouth every evening. 1-2 daily), Disp: 30 tablet, Rfl:  2 Allergies  Allergen Reactions   Gadolinium Derivatives Hives and Shortness Of Breath   Shellfish Allergy Anaphylaxis, Hives and Swelling   Dilaudid [Hydromorphone Hcl] Hives   Phenergan [Promethazine Hcl] Hives   Reglan [Metoclopramide] Other (See Comments)    Makes restless leg worse    ROS: A complete ROS was performed with pertinent positives/negatives noted in the HPI. The remainder of the ROS are negative.   Objective:   Today's Vitals   05/31/23 1314  BP: 126/80  Pulse: (!) 56  Temp: 98.1 F (36.7 C)  TempSrc: Temporal  SpO2: 98%  Weight: 178 lb (80.7 kg)  Height: 5\' 4"  (1.626 m)    GENERAL: Well-appearing, in NAD. Well nourished.  SKIN: Pink, warm and dry. No rash, lesion, ulceration, or ecchymoses.  NECK: Trachea midline. Full ROM w/o pain or tenderness. No lymphadenopathy.  (+) Goiter present RESPIRATORY: Chest wall symmetrical. Respirations even and non-labored. Breath sounds clear to auscultation bilaterally.  CARDIAC: S1, S2 present, regular rate and rhythm. Peripheral pulses 2+ bilaterally.  EXTREMITIES: Without clubbing, cyanosis, or edema.  NEUROLOGIC:  Steady, even gait.  PSYCH/MENTAL STATUS: Alert, oriented x 3. Cooperative, appropriate mood and affect.   Health Maintenance Due  Topic Date Due   HIV Screening  Never done   Hepatitis C Screening  Never done   DTaP/Tdap/Td (1 - Tdap) Never done   Colonoscopy  Never done   INFLUENZA VACCINE  04/21/2023    No results found for any visits on 05/31/23.  Assessment & Plan:  1. Hyperthyroidism - TSH - T4, free - Ambulatory referral to Endocrinology - Comp Met (CMET)  We will recheck thyroid levels and adjust methimazole if needed.  Referral placed to endocrinology for further treatment.  Orders Placed This Encounter  Procedures   TSH   T4, free   Comp Met (CMET)   Ambulatory referral to Endocrinology    Referral Priority:   Routine    Referral Type:   Consultation    Referral Reason:    Specialty Services Required    Number of Visits Requested:   1    Return in about 3 months (around 08/30/2023) for Chronic Condition follow up. Salvatore Decent, FNP

## 2023-05-31 NOTE — Patient Instructions (Addendum)
Decrease metoprolol 25mg  twice a day for 30 days then stop Continue Eliquis 5mg  twice a day for 30 days then stop

## 2023-05-31 NOTE — Progress Notes (Signed)
Primary Care Physician: Rolm Gala, NP Primary Cardiologist: None Electrophysiologist: None  Referring Physician: [facility]   Carmen Gutierrez is a 52 y.o. female with a history of breast cancer, hyperthyroidism, restless leg syndrome, GERD, atrial fibrillation who presents for consultation in the [facility].  The patient was initially diagnosed with atrial fibrillation 04/05/23 after presenting to the ED with symptoms of chest discomfort. Patient was started on Eliquis for a CHADS2VASC score of 1. She presented again to the ED 04/11/23 with chest and leg pain. She was still in rate controlled afib but otherwise workup was unremarkable.   On follow up today, patient reports that her heart rates have been in the 40's bpm at home. She is back in SR today. She has an appointment to establish with a new PCP today. No bleeding issues on anticoagulation.   Today, she denies symptoms of palpitations, chest pain, shortness of breath, orthopnea, PND, lower extremity edema, dizziness, presyncope, syncope, snoring, daytime somnolence, bleeding, or neurologic sequela. The patient is tolerating medications without difficulties and is otherwise without complaint today.    Atrial Fibrillation Risk Factors:  she does not have symptoms or diagnosis of sleep apnea. she does not have a history of rheumatic fever. she does not have a history of alcohol use. The patient does not have a history of early familial atrial fibrillation or other arrhythmias.  Atrial Fibrillation Management history:  Previous antiarrhythmic drugs: none Previous cardioversions: none Previous ablations: none Anticoagulation history: Eliquis  ROS- All systems are reviewed and negative except as per the HPI above.   Physical Exam: BP (!) 160/82   Pulse (!) 43   Ht 5\' 4"  (1.626 m)   Wt 80.8 kg   BMI 30.59 kg/m   GEN: Well nourished, well developed in no acute distress NECK: No JVD; No  carotid bruits CARDIAC: Regular rate and rhythm, no murmurs, rubs, gallops RESPIRATORY:  Clear to auscultation without rales, wheezing or rhonchi  ABDOMEN: Soft, non-tender, non-distended EXTREMITIES:  No edema; No deformity    Wt Readings from Last 3 Encounters:  05/31/23 80.8 kg  04/15/23 73.5 kg  04/05/23 63.5 kg     EKG today demonstrates  SB Vent. rate 43 BPM PR interval 134 ms QRS duration 80 ms QT/QTcB 454/383 ms  Echo 10/15/16 demonstrated  - Left ventricle: The cavity size was normal. Systolic function was    normal. The estimated ejection fraction was in the range of 55%    to 60%. Wall motion was normal; there were no regional wall    motion abnormalities. Left ventricular diastolic function    parameters were normal.  - Aortic valve: Valve area (VTI): 2.28 cm^2.  - Atrial septum: No defect or patent foramen ovale was identified.    CHA2DS2-VASc Score = 1  The patient's score is based upon: CHF History: 0 HTN History: 0 Diabetes History: 0 Stroke History: 0 Vascular Disease History: 0 Age Score: 0 Gender Score: 1       ASSESSMENT AND PLAN: Persistent Atrial Fibrillation (ICD10:  I48.19) The patient's CHA2DS2-VASc score is 1, indicating a 0.6% annual risk of stroke.   Suspect afib 2/2 hyperthyroidism  Now back in SR with treatment of her thyroid.  Will decrease her Lopressor to 25 mg BID and then discontinue after one month.  Apple watch for home monitoring.  Check echocardiogram  Since it was unknown when she reverted back to SR, will continue Eliquis 5 mg BID for 4 weeks  then discontinue with low CV score.   Hyperthyroidism On methimazole Establishing care with PCP today.     Follow up in the AF clinic pending results of echo.    Jorja Loa PA-C Af[facility] [address] [place], [place] [address] [phone]

## 2023-06-01 ENCOUNTER — Other Ambulatory Visit: Payer: Self-pay | Admitting: Internal Medicine

## 2023-06-01 DIAGNOSIS — E0590 Thyrotoxicosis, unspecified without thyrotoxic crisis or storm: Secondary | ICD-10-CM

## 2023-06-01 MED ORDER — METHIMAZOLE 10 MG PO TABS
10.0000 mg | ORAL_TABLET | Freq: Every day | ORAL | Status: DC
Start: 2023-06-01 — End: 2023-06-24

## 2023-06-09 ENCOUNTER — Other Ambulatory Visit (HOSPITAL_BASED_OUTPATIENT_CLINIC_OR_DEPARTMENT_OTHER): Payer: Self-pay

## 2023-06-15 ENCOUNTER — Ambulatory Visit (HOSPITAL_COMMUNITY)
Admission: RE | Admit: 2023-06-15 | Discharge: 2023-06-15 | Disposition: A | Discharge status: Home / Self Care | Payer: BC Managed Care – PPO | Source: Ambulatory Visit | Attending: Physician Assistant | Admitting: Physician Assistant

## 2023-06-15 DIAGNOSIS — I4891 Unspecified atrial fibrillation: Secondary | ICD-10-CM | POA: Diagnosis not present

## 2023-06-15 DIAGNOSIS — I4819 Other persistent atrial fibrillation: Secondary | ICD-10-CM

## 2023-06-15 LAB — ECHOCARDIOGRAM COMPLETE
AR max vel: 2.3 cm2
AV Area VTI: 2.2 cm2
AV Area mean vel: 2.24 cm2
AV Mean grad: 6 mmHg
AV Peak grad: 13.5 mmHg
Ao pk vel: 1.84 m/s
Area-P 1/2: 3.5 cm2
Calc EF: 55.9 %
MV VTI: 3.04 cm2
S' Lateral: 3.3 cm
Single Plane A2C EF: 52.8 %
Single Plane A4C EF: 58.9 %

## 2023-06-15 NOTE — Progress Notes (Signed)
*  PRELIMINARY RESULTS* Echocardiogram 2D Echocardiogram has been performed.  Carmen Gutierrez 06/15/2023, 3:43 PM

## 2023-06-16 ENCOUNTER — Encounter (HOSPITAL_COMMUNITY): Payer: Self-pay | Admitting: *Deleted

## 2023-06-21 ENCOUNTER — Other Ambulatory Visit: Payer: BC Managed Care – PPO

## 2023-06-22 ENCOUNTER — Other Ambulatory Visit (INDEPENDENT_AMBULATORY_CARE_PROVIDER_SITE_OTHER): Payer: BC Managed Care – PPO

## 2023-06-22 DIAGNOSIS — E0590 Thyrotoxicosis, unspecified without thyrotoxic crisis or storm: Secondary | ICD-10-CM

## 2023-06-22 LAB — T4, FREE: Free T4: 0.28 ng/dL — ABNORMAL LOW (ref 0.60–1.60)

## 2023-06-22 LAB — TSH: TSH: 32.49 u[IU]/mL — ABNORMAL HIGH (ref 0.35–5.50)

## 2023-06-24 ENCOUNTER — Other Ambulatory Visit (HOSPITAL_BASED_OUTPATIENT_CLINIC_OR_DEPARTMENT_OTHER): Payer: Self-pay

## 2023-06-24 ENCOUNTER — Ambulatory Visit: Payer: BC Managed Care – PPO | Admitting: Internal Medicine

## 2023-06-24 ENCOUNTER — Other Ambulatory Visit: Payer: Self-pay | Admitting: Family

## 2023-06-24 MED ORDER — LEVOTHYROXINE SODIUM 50 MCG PO TABS
50.0000 ug | ORAL_TABLET | Freq: Every day | ORAL | 3 refills | Status: DC
  Filled 2023-06-24: qty 30, 30d supply, fill #0
  Filled 2023-07-22: qty 30, 30d supply, fill #1

## 2023-06-27 ENCOUNTER — Ambulatory Visit: Payer: BC Managed Care – PPO | Admitting: Internal Medicine

## 2023-06-28 ENCOUNTER — Other Ambulatory Visit (HOSPITAL_BASED_OUTPATIENT_CLINIC_OR_DEPARTMENT_OTHER): Payer: Self-pay

## 2023-06-28 ENCOUNTER — Other Ambulatory Visit: Payer: Self-pay | Admitting: Family

## 2023-06-28 ENCOUNTER — Other Ambulatory Visit: Payer: BC Managed Care – PPO

## 2023-06-28 MED ORDER — PRAMIPEXOLE DIHYDROCHLORIDE 0.75 MG PO TABS
0.7500 mg | ORAL_TABLET | Freq: Every evening | ORAL | 0 refills | Status: DC
  Filled 2023-06-28: qty 180, 90d supply, fill #0

## 2023-06-29 ENCOUNTER — Other Ambulatory Visit (HOSPITAL_BASED_OUTPATIENT_CLINIC_OR_DEPARTMENT_OTHER): Payer: Self-pay

## 2023-06-30 ENCOUNTER — Other Ambulatory Visit (HOSPITAL_BASED_OUTPATIENT_CLINIC_OR_DEPARTMENT_OTHER): Payer: Self-pay

## 2023-06-30 ENCOUNTER — Other Ambulatory Visit: Payer: BC Managed Care – PPO

## 2023-07-18 ENCOUNTER — Telehealth: Payer: Self-pay | Admitting: Internal Medicine

## 2023-07-18 DIAGNOSIS — E0590 Thyrotoxicosis, unspecified without thyrotoxic crisis or storm: Secondary | ICD-10-CM

## 2023-07-18 NOTE — Telephone Encounter (Signed)
Please advise if labs need to be ordered.

## 2023-07-18 NOTE — Telephone Encounter (Signed)
Please double check to see if the appt is o.k. per her provider ( 10.31.24 lab appt for thyroid check )

## 2023-07-19 NOTE — Telephone Encounter (Signed)
Noted  

## 2023-07-19 NOTE — Addendum Note (Signed)
Addended by: Salvatore Decent on: 07/19/2023 11:12 AM   Modules accepted: Orders

## 2023-07-21 ENCOUNTER — Other Ambulatory Visit (INDEPENDENT_AMBULATORY_CARE_PROVIDER_SITE_OTHER): Payer: BC Managed Care – PPO

## 2023-07-21 DIAGNOSIS — E0590 Thyrotoxicosis, unspecified without thyrotoxic crisis or storm: Secondary | ICD-10-CM | POA: Diagnosis not present

## 2023-07-22 ENCOUNTER — Other Ambulatory Visit (HOSPITAL_BASED_OUTPATIENT_CLINIC_OR_DEPARTMENT_OTHER): Payer: Self-pay

## 2023-07-22 ENCOUNTER — Other Ambulatory Visit: Payer: Self-pay

## 2023-07-22 LAB — TSH: TSH: 0.02 u[IU]/mL — ABNORMAL LOW (ref 0.35–5.50)

## 2023-07-22 LAB — T4, FREE: Free T4: 1.89 ng/dL — ABNORMAL HIGH (ref 0.60–1.60)

## 2023-07-26 ENCOUNTER — Other Ambulatory Visit (HOSPITAL_BASED_OUTPATIENT_CLINIC_OR_DEPARTMENT_OTHER): Payer: Self-pay

## 2023-07-26 ENCOUNTER — Other Ambulatory Visit: Payer: Self-pay | Admitting: Internal Medicine

## 2023-07-26 DIAGNOSIS — E0590 Thyrotoxicosis, unspecified without thyrotoxic crisis or storm: Secondary | ICD-10-CM

## 2023-07-26 MED ORDER — METHIMAZOLE 5 MG PO TABS
5.0000 mg | ORAL_TABLET | Freq: Every day | ORAL | 0 refills | Status: DC
  Filled 2023-07-26: qty 30, 30d supply, fill #0

## 2023-07-27 DIAGNOSIS — R079 Chest pain, unspecified: Secondary | ICD-10-CM | POA: Diagnosis not present

## 2023-07-27 DIAGNOSIS — R0789 Other chest pain: Secondary | ICD-10-CM | POA: Diagnosis not present

## 2023-07-27 DIAGNOSIS — R110 Nausea: Secondary | ICD-10-CM | POA: Diagnosis not present

## 2023-07-27 DIAGNOSIS — R55 Syncope and collapse: Secondary | ICD-10-CM | POA: Diagnosis not present

## 2023-07-28 DIAGNOSIS — R079 Chest pain, unspecified: Secondary | ICD-10-CM | POA: Diagnosis not present

## 2023-08-01 ENCOUNTER — Telehealth: Payer: Self-pay

## 2023-08-01 NOTE — Transitions of Care (Post Inpatient/ED Visit) (Signed)
   08/01/2023  Name: Carmen Gutierrez MRN: [id number] DOB: December 13, 1970  Today's TOC FU Call Status: Today's TOC FU Call Status:: Unsuccessful Call (1st Attempt) Unsuccessful Call (1st Attempt) Date: 08/01/23  Attempted to reach the patient regarding the most recent Inpatient/ED visit.  Follow Up Plan: Additional outreach attempts will be made to reach the patient to complete the Transitions of Care (Post Inpatient/ED visit) call.   Signature Arvil Persons, BSN, Charity fundraiser

## 2023-08-03 ENCOUNTER — Telehealth: Payer: Self-pay | Admitting: Internal Medicine

## 2023-08-03 ENCOUNTER — Encounter: Payer: Self-pay | Admitting: Internal Medicine

## 2023-08-03 ENCOUNTER — Inpatient Hospital Stay: Payer: BC Managed Care – PPO | Admitting: Internal Medicine

## 2023-08-03 NOTE — Telephone Encounter (Signed)
No show/ no letter sent

## 2023-08-03 NOTE — Telephone Encounter (Signed)
9:10 to cancel. pt said she is sick. Did not reschedule. 1st missed visit. Letter sent via mychart.

## 2023-08-03 NOTE — Telephone Encounter (Signed)
Provider aware

## 2023-08-05 ENCOUNTER — Other Ambulatory Visit (HOSPITAL_BASED_OUTPATIENT_CLINIC_OR_DEPARTMENT_OTHER): Payer: Self-pay

## 2023-08-05 ENCOUNTER — Other Ambulatory Visit: Payer: Self-pay

## 2023-08-05 DIAGNOSIS — E0590 Thyrotoxicosis, unspecified without thyrotoxic crisis or storm: Secondary | ICD-10-CM

## 2023-08-05 NOTE — Telephone Encounter (Signed)
No cancellation fee 

## 2023-08-05 NOTE — Transitions of Care (Post Inpatient/ED Visit) (Signed)
   08/05/2023  Name: Carmen Gutierrez MRN: [id number] DOB: 1971/06/30  Today's TOC FU Call Status: Today's TOC FU Call Status:: Unsuccessful Call (2nd Attempt) Unsuccessful Call (1st Attempt) Date: 08/01/23 Unsuccessful Call (2nd Attempt) Date: 08/05/23  Attempted to reach the patient regarding the most recent Inpatient/ED visit.  Follow Up Plan: Additional outreach attempts will be made to reach the patient to complete the Transitions of Care (Post Inpatient/ED visit) call.   Signature Arvil Persons, BSN, Charity fundraiser

## 2023-08-10 ENCOUNTER — Other Ambulatory Visit (INDEPENDENT_AMBULATORY_CARE_PROVIDER_SITE_OTHER): Payer: BC Managed Care – PPO

## 2023-08-10 DIAGNOSIS — E0590 Thyrotoxicosis, unspecified without thyrotoxic crisis or storm: Secondary | ICD-10-CM | POA: Diagnosis not present

## 2023-08-10 LAB — T3, FREE: T3, Free: 3.8 pg/mL (ref 2.3–4.2)

## 2023-08-10 LAB — T4, FREE: Free T4: 1.11 ng/dL (ref 0.60–1.60)

## 2023-08-10 LAB — TSH: TSH: 0.01 u[IU]/mL — ABNORMAL LOW (ref 0.35–5.50)

## 2023-08-12 NOTE — Telephone Encounter (Signed)
Left voicemail for patient to call the office to schedule Ov

## 2023-08-15 ENCOUNTER — Other Ambulatory Visit (HOSPITAL_BASED_OUTPATIENT_CLINIC_OR_DEPARTMENT_OTHER): Payer: Self-pay

## 2023-08-15 ENCOUNTER — Ambulatory Visit: Payer: BC Managed Care – PPO | Admitting: "Endocrinology

## 2023-08-15 ENCOUNTER — Encounter: Payer: Self-pay | Admitting: "Endocrinology

## 2023-08-15 VITALS — BP 138/84 | HR 90 | Ht 64.0 in | Wt 183.2 lb

## 2023-08-15 DIAGNOSIS — E049 Nontoxic goiter, unspecified: Secondary | ICD-10-CM | POA: Diagnosis not present

## 2023-08-15 DIAGNOSIS — E0500 Thyrotoxicosis with diffuse goiter without thyrotoxic crisis or storm: Secondary | ICD-10-CM

## 2023-08-15 LAB — TRAB (TSH RECEPTOR BINDING ANTIBODY): TRAB: 10.39 IU/L — ABNORMAL HIGH (ref <=2.00)

## 2023-08-15 LAB — THYROID STIMULATING IMMUNOGLOBULIN: TSI: 495 % baseline — ABNORMAL HIGH (ref <140)

## 2023-08-15 MED ORDER — METHIMAZOLE 5 MG PO TABS
5.0000 mg | ORAL_TABLET | Freq: Every day | ORAL | 1 refills | Status: DC
  Filled 2023-08-15: qty 90, 90d supply, fill #0
  Filled 2023-11-24: qty 90, 90d supply, fill #1

## 2023-08-15 NOTE — Progress Notes (Signed)
Outpatient Endocrinology Note Carmen Sidney, MD  08/15/23   Carmen Gutierrez 01-16-1971 [id number]  Referring Provider: Salvatore Decent, FNP Primary Care Provider: Salvatore Decent, FNP Subjective  No chief complaint on file.   Assessment & Plan  Diagnoses and all orders for this visit:  Graves disease -     methimazole (TAPAZOLE) 5 MG tablet; Take 1 tablet (5 mg total) by mouth daily. -     T3, free -     T4, free -     TSH  Goiter   Savona Dorch is currently taking methimazole 5 mg every day since 2 weeks. Patient is currently biochemically euthyroid with normal FT4 and FT3, TSH low.  Discussed the etiology for hyperthyroidism. Educated on thyroid axis.  Recommend the following: Take methimazole 5 mg once a day. Repeat labs in 3 months or sooner if symptoms of hyper or hypothyroidism develop.  Educated on definitive options of treatment including RAI therapy and surgery. Patient does not want RAI at this time.  Counseled on: -complications of untreated hyperthyroidism including atrial fibrillation, heart failure and osteoporosis -side effects of Methimazole including but not limited to allergic reaction, rash, bone marrow suppression, liver dysfunction and teratogenic potential -implications in pregnancy and breastfeeding -compliance and follow up needs    If you notice any symptoms of worsening fatigue, fever with sore throat, loss of appetite, yellowing of eyes, dark urine, joint pains, sores in the mouth, itchy rash, light colored stools or abdominal pain, please stop the medication and call us immediately as this can be a serious side effect of the medication.  Has thyroid ultrasound in 2024 that showed mild thyromegaly without nodules No images available, reviewed report Pemberton's sign -ve Continue monitoring clinically Pt is not interested in thyroidectomy offered by ENT   I have reviewed current medications, nurse's notes, allergies, vital signs, past  medical and surgical history, family medical history, and social history for this encounter. Counseled patient on symptoms, examination findings, lab findings, imaging results, treatment decisions and monitoring and prognosis. The patient understood the recommendations and agrees with the treatment plan. All questions regarding treatment plan were fully answered.   Return in about 3 months (around 11/15/2023) for visit + labs before next visit.   Carmen Trinidad, MD  08/15/23   I have reviewed current medications, nurse's notes, allergies, vital signs, past medical and surgical history, family medical history, and social history for this encounter. Counseled patient on symptoms, examination findings, lab findings, imaging results, treatment decisions and monitoring and prognosis. The patient understood the recommendations and agrees with the treatment plan. All questions regarding treatment plan were fully answered.   History of Present Illness Carmen Gutierrez is a 52 y.o. year old female who presents to our clinic with Graves disease diagnosed in 01/2023.    Symptoms suggestive of HYPOTHYROIDISM:  fatigue No weight gain Yes cold intolerance  Yes, varies between hot and cold constipation  No  Symptoms suggestive of HYPERTHYROIDISM:  weight loss  No (lost 100 lbs in 2023 and gained 60 lbs in 2024) heat intolerance Yes, varies between hot and cold hyperdefecation  No palpitations  Yes  Compressive symptoms:  dysphagia  No dysphonia  No positional dyspnea (especially with simultaneous arms elevation)  No  Smokes  No On biotin  No Personal history of head/neck surgery/irradiation  No  Adverse Drug Effects from Methimazole (MMI): none  Grave's Ophthalmopathy Clinical Activity Score: 0/9  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  Physical Exam  BP 138/84   Pulse 90   Ht 5\' 4"  (1.626 m)   Wt 183 lb 3.2 oz (83.1 kg)   SpO2 96%   BMI 31.45 kg/m   Constitutional: well developed, well nourished Head: normocephalic, atraumatic, no exophthalmos Eyes: sclera anicteric, no redness Neck: + thyromegaly, no thyroid tenderness; no nodules palpated Lungs: normal respiratory effort Neurology: alert and oriented, no fine hand tremor Skin: dry, no appreciable rashes Musculoskeletal: no appreciable defects Psychiatric: normal mood and affect  Allergies Allergies  Allergen Reactions   Gadolinium Derivatives Hives and Shortness Of Breath   Shellfish Allergy Anaphylaxis, Hives and Swelling   Dilaudid [Hydromorphone Hcl] Hives   Phenergan [Promethazine Hcl] Hives   Reglan [Metoclopramide] Other (See Comments)    Makes restless leg worse    Current Medications Patient's Medications  New Prescriptions   No medications on file  Previous Medications   APIXABAN (ELIQUIS) 5 MG TABS TABLET    Take 1 tablet (5 mg total) by mouth 2 (two) times daily.   METOPROLOL TARTRATE (LOPRESSOR) 25 MG TABLET    Take 1 tablet (25 mg total) by mouth 2 (two) times daily.   OMEPRAZOLE (PRILOSEC) 40 MG CAPSULE    Take 40 mg by mouth daily.   ONDANSETRON (ZOFRAN ODT) 8 MG DISINTEGRATING TABLET    Take 1 tablet (8 mg total) by mouth every 8 (eight) hours as needed for nausea or vomiting.   PRAMIPEXOLE (MIRAPEX) 0.75 MG TABLET    Take 1 tablet (0.75 mg total) by mouth every evening. 1-2 daily  Modified Medications   Modified Medication Previous Medication   METHIMAZOLE (TAPAZOLE) 5 MG TABLET methimazole (TAPAZOLE) 5 MG tablet      Take 1 tablet (5 mg total) by mouth daily.    Take 1 tablet (5 mg total) by mouth daily.  Discontinued Medications   No medications on file    Past Medical History Past Medical History:  Diagnosis Date   Anxiety    Breast cancer (HCC) 2014   right breast   Cancer (HCC)    Depression    GERD (gastroesophageal reflux disease)    History of kidney stones    Migraines    Personal history of radiation therapy 2014   R   Restless  leg syndrome     Past Surgical History Past Surgical History:  Procedure Laterality Date   ABDOMINAL HYSTERECTOMY     BREAST BIOPSY     BREAST LUMPECTOMY Right 2014   BREAST SURGERY     c-sections x2     CHOLECYSTECTOMY     EXTRACORPOREAL SHOCK WAVE LITHOTRIPSY Left 03/30/2018   Procedure: LEFT EXTRACORPOREAL SHOCK WAVE LITHOTRIPSY (ESWL);  Surgeon: Malen Gauze, MD;  Location: [facility];  Service: Urology;  Laterality: Left;   HEMORROIDECTOMY      Family History family history is not on file.  Social History Social History   Socioeconomic History   Marital status: Legally Separated    Spouse name: Not on file   Number of children: Not on file   Years of education: Not on file   Highest education level: Not on file  Occupational History   Not on file  Tobacco Use   Smoking status: Never   Smokeless tobacco: Never  Vaping Use   Vaping status: Never Used  Substance and Sexual Activity   Alcohol use: Not Currently    Comment: occasional   Drug use: No   Sexual activity: Not on file  Other Topics  Concern   Not on file  Social History Narrative   Not on file   Social Determinants of Health   Financial Resource Strain: Not on file  Food Insecurity: Not on file  Transportation Needs: Not on file  Physical Activity: Not on file  Stress: Not on file  Social Connections: Unknown (02/02/2022)   Received from [facility], [facility]   Social Network    Social Network: Not on file  Intimate Partner Violence: Unknown (12/25/2021)   Received from [facility], [facility]   HITS    Physically Hurt: Not on file    Insult or Talk Down To: Not on file    Threaten Physical Harm: Not on file    Scream or Curse: Not on file    Laboratory Investigations Lab Results  Component Value Date   TSH 0.01 (L) 08/10/2023   TSH 0.02 (L) 07/21/2023   TSH 32.49 (H) 06/22/2023   FREET4 1.11 08/10/2023   FREET4 1.89 (H) 07/21/2023   FREET4 0.28 (L) 06/22/2023     Lab  Results  Component Value Date   TSI 495 (H) 08/10/2023     No components found for: "TRAB"   Lab Results  Component Value Date   CHOL 158 10/15/2016   Lab Results  Component Value Date   HDL 45 10/15/2016   Lab Results  Component Value Date   LDLCALC 93 10/15/2016   Lab Results  Component Value Date   TRIG 98 10/15/2016   Lab Results  Component Value Date   CHOLHDL 3.5 10/15/2016   Lab Results  Component Value Date   CREATININE 0.64 05/31/2023   Lab Results  Component Value Date   GFR 102.62 05/31/2023      Component Value Date/Time   NA 140 05/31/2023 1400   K 3.9 05/31/2023 1400   CL 103 05/31/2023 1400   CO2 31 05/31/2023 1400   GLUCOSE 86 05/31/2023 1400   BUN 10 05/31/2023 1400   CREATININE 0.64 05/31/2023 1400   CALCIUM 8.8 05/31/2023 1400   PROT 6.9 05/31/2023 1400   ALBUMIN 4.1 05/31/2023 1400   AST 28 05/31/2023 1400   ALT 18 05/31/2023 1400   ALKPHOS 249 (H) 05/31/2023 1400   BILITOT 0.6 05/31/2023 1400   GFRNONAA >60 04/11/2023 1001   GFRAA >60 09/20/2018 1632      Latest Ref Rng & Units 05/31/2023    2:00 PM 04/11/2023   10:01 AM 04/05/2023   11:22 AM  BMP  Glucose 70 - 99 mg/dL 86  91  93   BUN 6 - 23 mg/dL 10  10  7    Creatinine 0.40 - 1.20 mg/dL 7.82  9.56  2.13   Sodium 135 - 145 mEq/L 140  139  139   Potassium 3.5 - 5.1 mEq/L 3.9  4.4  4.6   Chloride 96 - 112 mEq/L 103  106  107   CO2 19 - 32 mEq/L 31  25  23    Calcium 8.4 - 10.5 mg/dL 8.8  8.7  9.5        Component Value Date/Time   WBC 7.3 04/11/2023 1001   RBC 4.57 04/11/2023 1001   HGB 13.3 04/11/2023 1001   HCT 40.1 04/11/2023 1001   PLT 294 04/11/2023 1001   MCV 87.7 04/11/2023 1001   MCH 29.1 04/11/2023 1001   MCHC 33.2 04/11/2023 1001   RDW 14.3 04/11/2023 1001   LYMPHSABS 1.9 09/20/2018 1632   MONOABS 0.5 09/20/2018 1632  EOSABS 0.1 09/20/2018 1632   BASOSABS 0.0 09/20/2018 1632      Parts of this note may have been dictated using voice recognition software.  There may be variances in spelling and vocabulary which are unintentional. Not all errors are proofread. Please notify the Thereasa Parkin if any discrepancies are noted or if the meaning of any statement is not clear.

## 2023-08-20 ENCOUNTER — Other Ambulatory Visit (HOSPITAL_BASED_OUTPATIENT_CLINIC_OR_DEPARTMENT_OTHER): Payer: Self-pay

## 2023-08-22 NOTE — Progress Notes (Unsigned)
[facility] [address] [place] [address] Dept: [phone] Dept Fax: [phone]    Subjective:   Carmen Gutierrez 05-19-1971 08/23/2023  No chief complaint on file.   HPI: Carmen Gutierrez presents today for re-assessment and management of chronic medical conditions. Discussed the use of AI scribe software for clinical note transcription with the patient, who gave verbal consent to proceed.  History of Present Illness            The following portions of the patient's history were reviewed and updated as appropriate: past medical history, past surgical history, family history, social history, allergies, medications, and problem list.   Patient Active Problem List   Diagnosis Date Noted   Persistent atrial fibrillation (HCC) 04/15/2023   Hyperthyroidism 02/19/2023   Migraines 10/14/2016   GERD (gastroesophageal reflux disease) 10/14/2016   Restless leg syndrome 10/14/2016   Past Medical History:  Diagnosis Date   Anxiety    Breast cancer (HCC) 2014   right breast   Cancer (HCC)    Depression    GERD (gastroesophageal reflux disease)    History of kidney stones    Migraines    Personal history of radiation therapy 2014   R   Restless leg syndrome    Past Surgical History:  Procedure Laterality Date   ABDOMINAL HYSTERECTOMY     BREAST BIOPSY     BREAST LUMPECTOMY Right 2014   BREAST SURGERY     c-sections x2     CHOLECYSTECTOMY     EXTRACORPOREAL SHOCK WAVE LITHOTRIPSY Left 03/30/2018   Procedure: LEFT EXTRACORPOREAL SHOCK WAVE LITHOTRIPSY (ESWL);  Surgeon: Malen Gauze, MD;  Location: [facility];  Service: Urology;  Laterality: Left;   HEMORROIDECTOMY     No family history on file.  Current Outpatient Medications:    apixaban (ELIQUIS) 5 MG TABS tablet, Take 1 tablet (5 mg total) by mouth 2 (two) times daily., Disp: 60 tablet, Rfl: 0   methimazole (TAPAZOLE) 5 MG tablet, Take 1 tablet (5 mg  total) by mouth daily., Disp: 90 tablet, Rfl: 1   metoprolol tartrate (LOPRESSOR) 25 MG tablet, Take 1 tablet (25 mg total) by mouth 2 (two) times daily., Disp: 60 tablet, Rfl: 0   omeprazole (PRILOSEC) 40 MG capsule, Take 40 mg by mouth daily., Disp: , Rfl:    ondansetron (ZOFRAN ODT) 8 MG disintegrating tablet, Take 1 tablet (8 mg total) by mouth every 8 (eight) hours as needed for nausea or vomiting., Disp: 12 tablet, Rfl: 0   pramipexole (MIRAPEX) 0.75 MG tablet, Take 1 tablet (0.75 mg total) by mouth every evening. 1-2 daily, Disp: 180 tablet, Rfl: 0 Allergies  Allergen Reactions   Gadolinium Derivatives Hives and Shortness Of Breath   Shellfish Allergy Anaphylaxis, Hives and Swelling   Dilaudid [Hydromorphone Hcl] Hives   Phenergan [Promethazine Hcl] Hives   Reglan [Metoclopramide] Other (See Comments)    Makes restless leg worse     ROS: A complete ROS was performed with pertinent positives/negatives noted in the HPI. The remainder of the ROS are negative.    Objective:   There were no vitals filed for this visit.  GENERAL: Well-appearing, in NAD. Well nourished.  SKIN: Pink, warm and dry. No rash, lesion, ulceration, or ecchymoses.  NECK: Trachea midline. Full ROM w/o pain or tenderness. No lymphadenopathy.  RESPIRATORY: Chest wall symmetrical. Respirations even and non-labored. Breath sounds clear to auscultation bilaterally.  CARDIAC: S1, S2 present, regular rate and rhythm. Peripheral pulses  2+ bilaterally.  MSK: Muscle tone and strength appropriate for age. Joints w/o tenderness, redness, or swelling.  EXTREMITIES: Without clubbing, cyanosis, or edema.  NEUROLOGIC: No motor or sensory deficits. Steady, even gait.  PSYCH/MENTAL STATUS: Alert, oriented x 3. Cooperative, appropriate mood and affect.   Health Maintenance Due  Topic Date Due   HIV Screening  Never done   Hepatitis C Screening  Never done   DTaP/Tdap/Td (1 - Tdap) Never done   Cervical Cancer Screening  (HPV/Pap Cotest)  Never done   Colonoscopy  Never done   INFLUENZA VACCINE  04/21/2023    No results found for any visits on 08/23/23.  The ASCVD Risk score (Arnett DK, et al., 2019) failed to calculate for the following reasons:   Cannot find a previous HDL lab   Cannot find a previous total cholesterol lab     Assessment & Plan:  Assessment and Plan            There are no diagnoses linked to this encounter. No orders of the defined types were placed in this encounter.  No images are attached to the encounter or orders placed in the encounter. No orders of the defined types were placed in this encounter.   No follow-ups on file.   Salvatore Decent, FNP

## 2023-08-23 ENCOUNTER — Ambulatory Visit (INDEPENDENT_AMBULATORY_CARE_PROVIDER_SITE_OTHER): Payer: BC Managed Care – PPO | Admitting: Internal Medicine

## 2023-08-23 ENCOUNTER — Encounter: Payer: Self-pay | Admitting: Internal Medicine

## 2023-08-23 VITALS — BP 132/80 | HR 70 | Temp 98.2°F | Ht 64.0 in | Wt 185.0 lb

## 2023-08-23 DIAGNOSIS — N281 Cyst of kidney, acquired: Secondary | ICD-10-CM | POA: Diagnosis not present

## 2023-08-23 DIAGNOSIS — I7300 Raynaud's syndrome without gangrene: Secondary | ICD-10-CM | POA: Insufficient documentation

## 2023-08-23 DIAGNOSIS — G2581 Restless legs syndrome: Secondary | ICD-10-CM | POA: Diagnosis not present

## 2023-08-23 DIAGNOSIS — K219 Gastro-esophageal reflux disease without esophagitis: Secondary | ICD-10-CM | POA: Diagnosis not present

## 2023-08-23 DIAGNOSIS — E0590 Thyrotoxicosis, unspecified without thyrotoxic crisis or storm: Secondary | ICD-10-CM

## 2023-08-23 DIAGNOSIS — Z23 Encounter for immunization: Secondary | ICD-10-CM

## 2023-08-23 DIAGNOSIS — Z1211 Encounter for screening for malignant neoplasm of colon: Secondary | ICD-10-CM

## 2023-08-23 DIAGNOSIS — Z1231 Encounter for screening mammogram for malignant neoplasm of breast: Secondary | ICD-10-CM

## 2023-08-30 ENCOUNTER — Ambulatory Visit: Payer: BC Managed Care – PPO | Admitting: Internal Medicine

## 2023-09-01 ENCOUNTER — Encounter: Payer: Self-pay | Admitting: Internal Medicine

## 2023-09-02 ENCOUNTER — Encounter: Payer: Self-pay | Admitting: Internal Medicine

## 2023-09-05 IMAGING — MG DIGITAL DIAGNOSTIC BILAT W/ TOMO W/ CAD
8 of 14 series · 8 of 40 positions shown · non-contrast
Comparison: Previous exam(s).

CLINICAL DATA: 50-year-old female for annual follow-up. History of
RIGHT breast cancer and lumpectomy in 7939.

EXAM:
DIGITAL DIAGNOSTIC BILATERAL MAMMOGRAM WITH TOMOSYNTHESIS AND CAD
TECHNIQUE: Bilateral digital diagnostic mammography and breast tomosynthesis
was performed. The images were evaluated with computer-aided
detection.

[R CC synth-2D (1 of 2)]
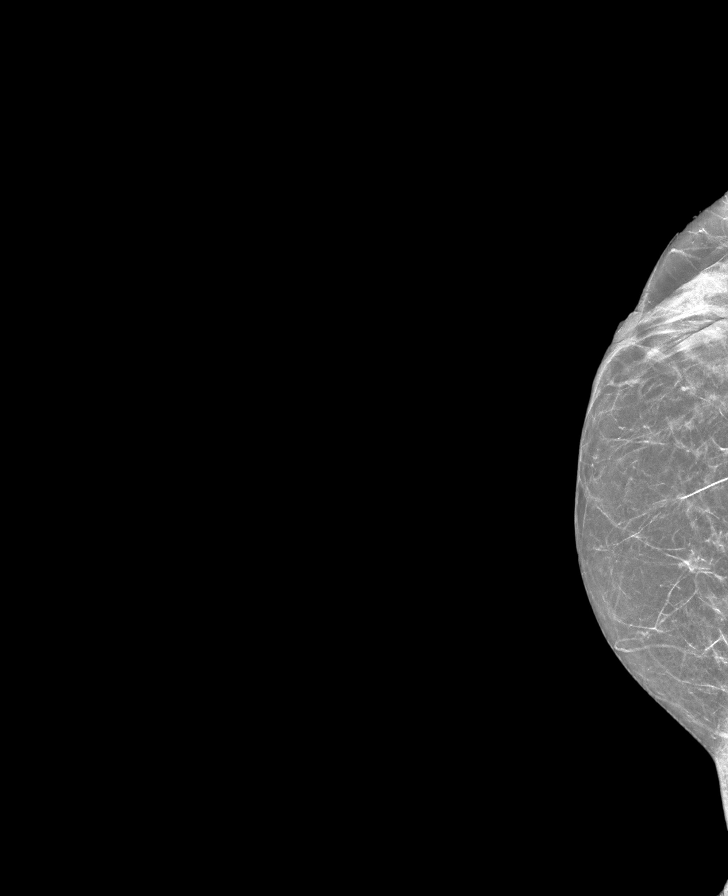

[R MLO synth-2D]
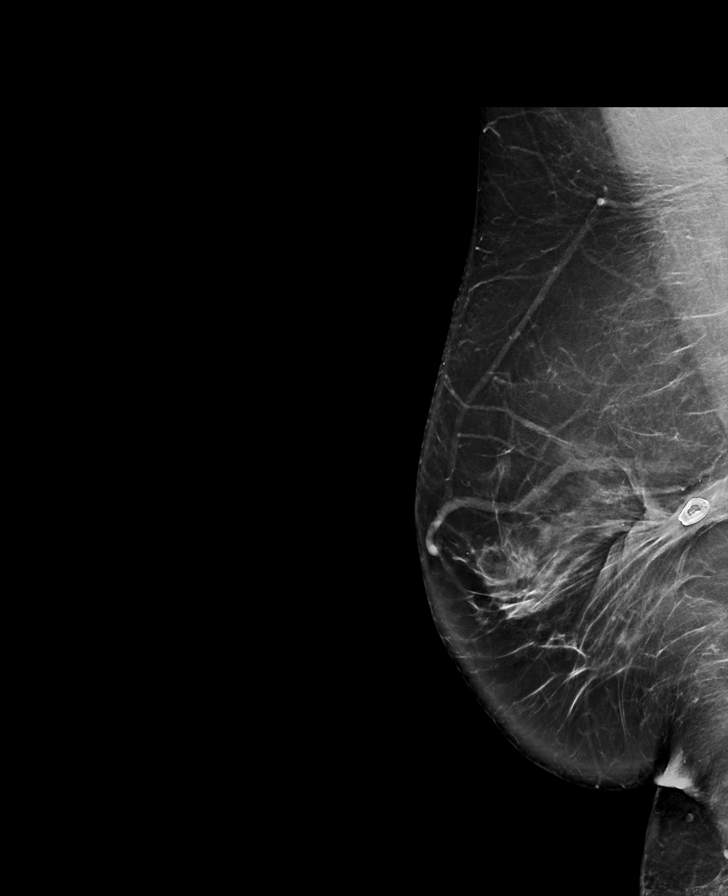

[R CC synth-2D (2 of 2)]
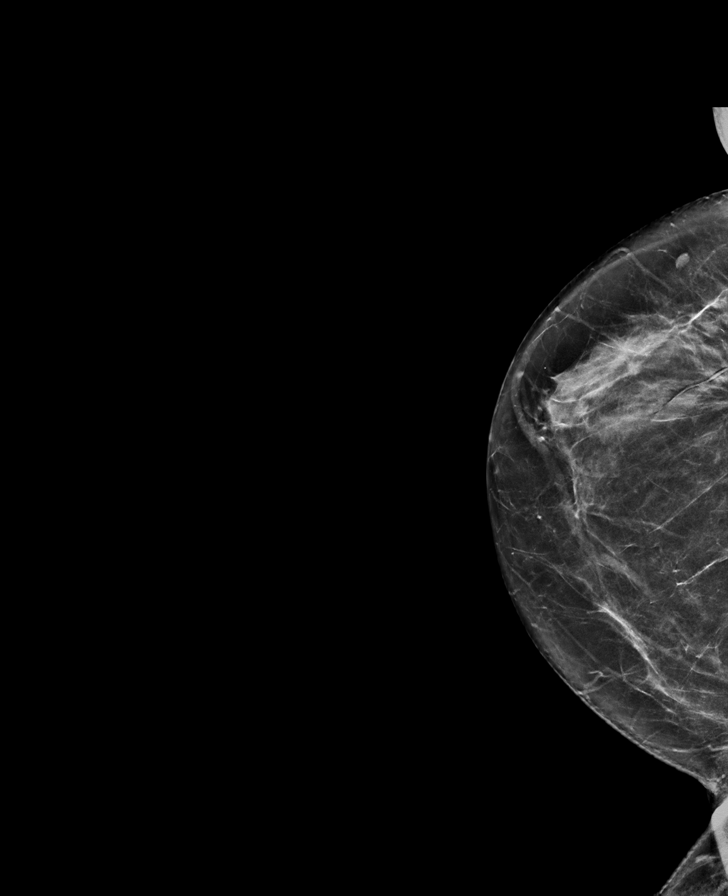

[L MLO synth-2D (1 of 3)]
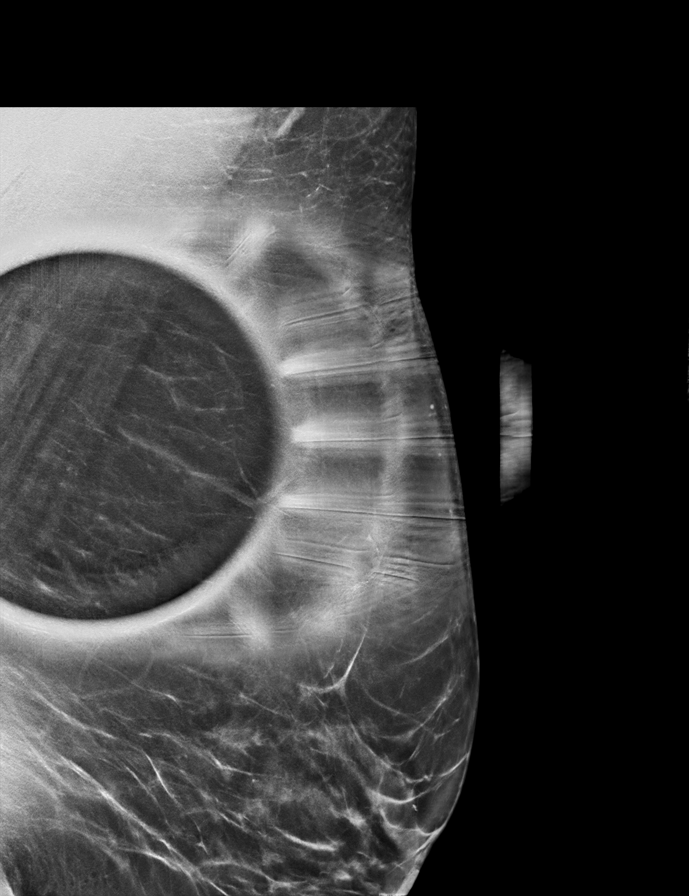

[L MLO synth-2D (2 of 3)]
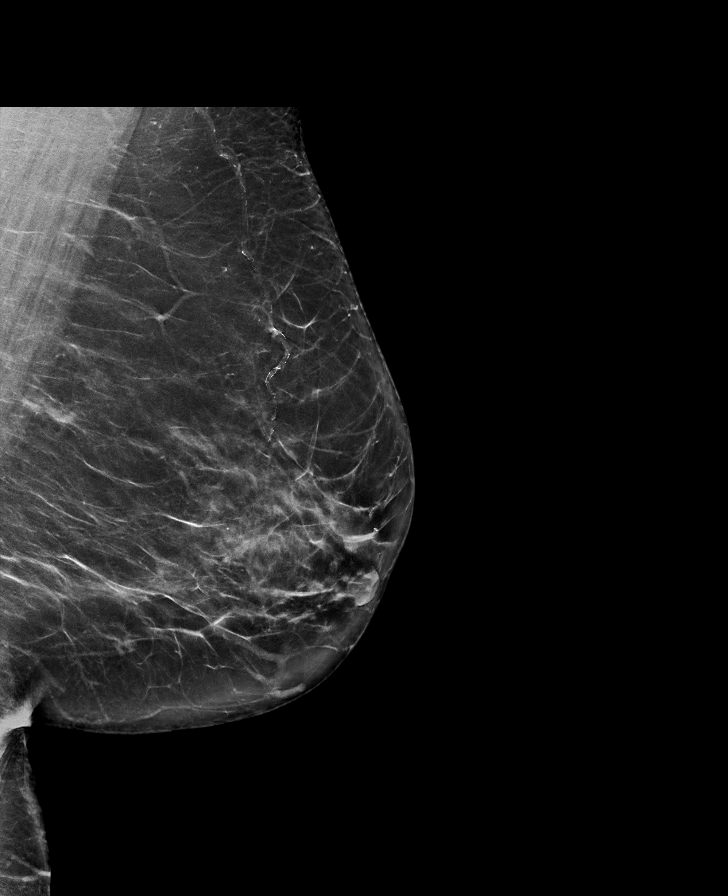

[L MLO synth-2D (3 of 3)]
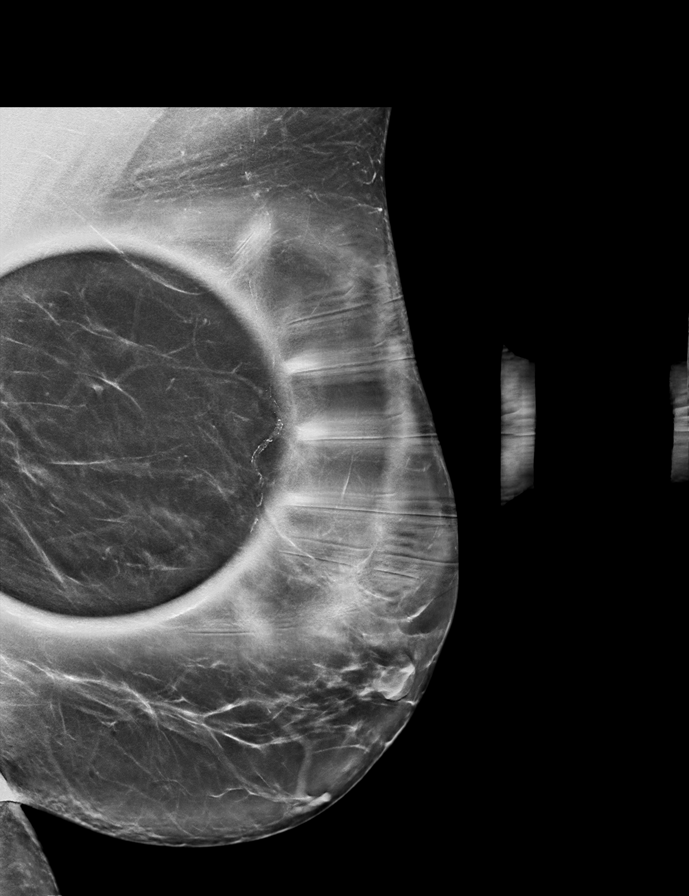

[L CC synth-2D]
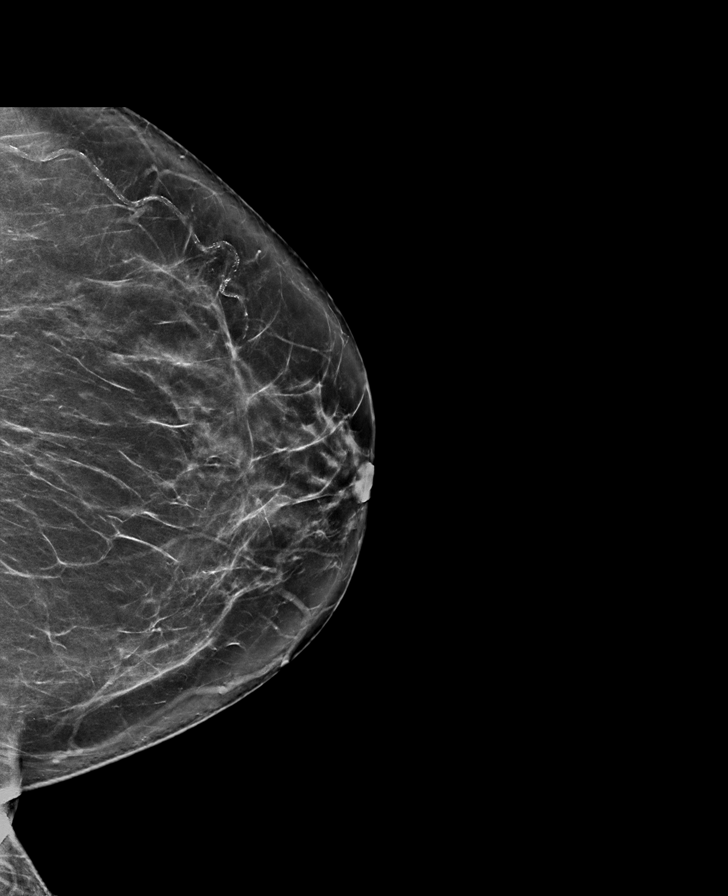

[R CC tomo · tomo slice 33/66.0]
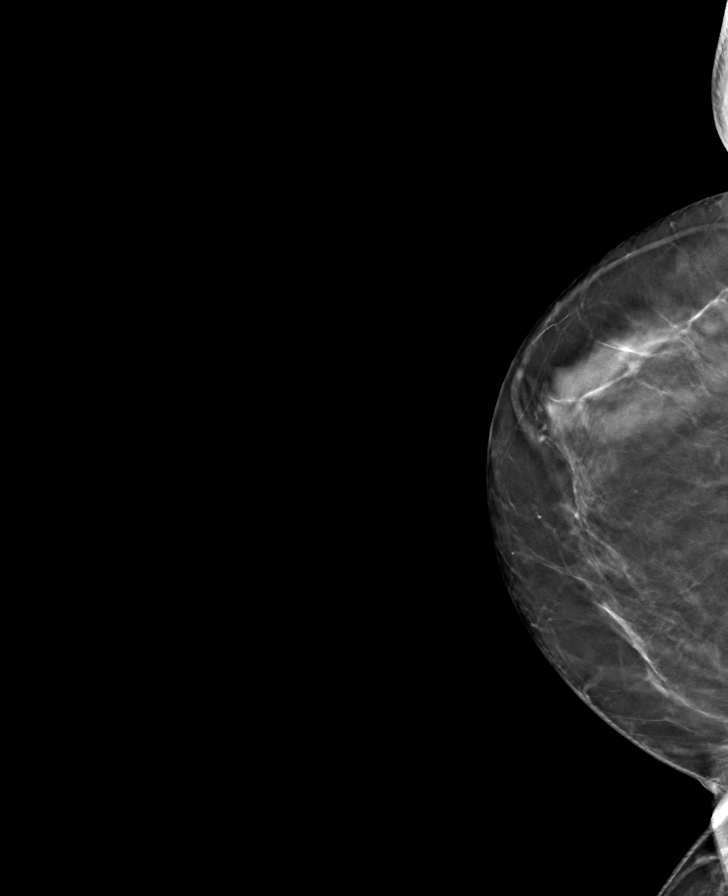

[8 of 40 positions shown; findings below may reference images not displayed]

ACR Breast Density Category b: There are scattered areas of
fibroglandular density.
FINDINGS: 2D and 3D full field views of both breasts and spot compression
views of the LEFT breast demonstrate no suspicious mass, nonsurgical
distortion or worrisome calcifications.

RIGHT lumpectomy changes are again noted.
IMPRESSION: No evidence of breast malignancy.

RECOMMENDATION:
Per protocol, as the patient is now 2 or more years status post
lumpectomy, she may return to annual screening mammography in 1
year. However, given the history of breast cancer, the patient
remains eligible for annual diagnostic mammography if preferred.
(Code:[id number]-[id number])

I have discussed the findings and recommendations with the patient.
If applicable, a reminder letter will be sent to the patient
regarding the next appointment.

BI-RADS CATEGORY  2: Benign.

## 2023-10-19 ENCOUNTER — Encounter: Payer: Self-pay | Admitting: Internal Medicine

## 2023-11-11 ENCOUNTER — Other Ambulatory Visit: Payer: BC Managed Care – PPO

## 2023-11-16 ENCOUNTER — Ambulatory Visit: Payer: BC Managed Care – PPO | Admitting: "Endocrinology

## 2023-11-23 ENCOUNTER — Encounter: Payer: Self-pay | Admitting: Internal Medicine

## 2023-11-23 DIAGNOSIS — N2889 Other specified disorders of kidney and ureter: Secondary | ICD-10-CM

## 2023-11-23 DIAGNOSIS — N281 Cyst of kidney, acquired: Secondary | ICD-10-CM

## 2023-11-24 ENCOUNTER — Other Ambulatory Visit (HOSPITAL_BASED_OUTPATIENT_CLINIC_OR_DEPARTMENT_OTHER): Payer: Self-pay

## 2023-11-25 ENCOUNTER — Encounter: Payer: Self-pay | Admitting: Internal Medicine

## 2023-12-08 ENCOUNTER — Encounter: Payer: Self-pay | Admitting: Internal Medicine

## 2023-12-08 NOTE — Progress Notes (Unsigned)
 Subjective:   Carmen Gutierrez 1971-07-24  12/09/2023   CC: No chief complaint on file.   HPI: Carmen Gutierrez is a 53 y.o. female who presents for a routine health maintenance exam.  Labs collected at time of visit.    HEALTH SCREENINGS: - Pap smear: not applicable total hysterectomy  - Mammogram (40+):  scheduled for 12/16/2023   - Colonoscopy (45+): Up to date  - Bone Density (65+): Not applicable  - Lung CA screening with low-dose CT:  {Blank single:19197::"Up to date","Ordered today","Not applicable","Declined","Done elsewhere"} Adults age 19-80 who are current cigarette smokers or quit within the last 15 years. Must have 20 pack year history.   Depression and Anxiety Screen done today and results listed below:     08/23/2023    8:19 AM 05/31/2023    1:22 PM  Depression screen PHQ 2/9  Decreased Interest 0 0  Down, Depressed, Hopeless 0 0  PHQ - 2 Score 0 0  Altered sleeping  3  Tired, decreased energy  3  Change in appetite  0  Feeling bad or failure about yourself   0  Trouble concentrating  2  Moving slowly or fidgety/restless  0  Suicidal thoughts  0  PHQ-9 Score  8  Difficult doing work/chores  Somewhat difficult      05/31/2023    1:22 PM  GAD 7 : Generalized Anxiety Score  Nervous, Anxious, on Edge 0  Control/stop worrying 1  Worry too much - different things 1  Trouble relaxing 1  Restless 1  Easily annoyed or irritable 1  Afraid - awful might happen 0  Total GAD 7 Score 5  Anxiety Difficulty Somewhat difficult    IMMUNIZATIONS: - Tdap: Tetanus vaccination status reviewed: {tetanus status:315746}. - HPV: Not applicable - Influenza: Up to date - Zostavax (50+): {Blank single:19197::"Up to date","Administered today","Not applicable","Refused","Given elsewhere"}   Past medical history, surgical history, medications, allergies, family history and social history reviewed with patient today and changes made to appropriate areas of the chart.   Past  Medical History:  Diagnosis Date   Anxiety    Breast cancer (HCC) 2014   right breast   Cancer (HCC)    Depression    GERD (gastroesophageal reflux disease)    History of kidney stones    Migraines    Persistent atrial fibrillation (HCC) 04/15/2023   Personal history of radiation therapy 2014   R   Restless leg syndrome     Past Surgical History:  Procedure Laterality Date   ABDOMINAL HYSTERECTOMY     BREAST BIOPSY     BREAST LUMPECTOMY Right 2014   BREAST SURGERY     c-sections x2     CHOLECYSTECTOMY     EXTRACORPOREAL SHOCK WAVE LITHOTRIPSY Left 03/30/2018   Procedure: LEFT EXTRACORPOREAL SHOCK WAVE LITHOTRIPSY (ESWL);  Surgeon: Malen Gauze, MD;  Location: [facility];  Service: Urology;  Laterality: Left;   HEMORROIDECTOMY      Current Outpatient Medications on File Prior to Visit  Medication Sig   methimazole (TAPAZOLE) 5 MG tablet Take 1 tablet (5 mg total) by mouth daily.   omeprazole (PRILOSEC) 40 MG capsule Take 40 mg by mouth daily.   ondansetron (ZOFRAN ODT) 8 MG disintegrating tablet Take 1 tablet (8 mg total) by mouth every 8 (eight) hours as needed for nausea or vomiting.   pramipexole (MIRAPEX) 0.75 MG tablet Take 1 tablet (0.75 mg total) by mouth every evening. 1-2 daily   No current facility-administered medications on file  prior to visit.    Allergies  Allergen Reactions   Gadolinium Derivatives Hives and Shortness Of Breath   Shellfish Allergy Anaphylaxis, Hives and Swelling   Dilaudid [Hydromorphone Hcl] Hives   Phenergan [Promethazine Hcl] Hives   Reglan [Metoclopramide] Other (See Comments)    Makes restless leg worse     Social History   Socioeconomic History   Marital status: Legally Separated    Spouse name: Not on file   Number of children: Not on file   Years of education: Not on file   Highest education level: Some college, no degree  Occupational History   Not on file  Tobacco Use   Smoking status: Never   Smokeless tobacco:  Never  Vaping Use   Vaping status: Never Used  Substance and Sexual Activity   Alcohol use: Not Currently    Comment: occasional   Drug use: No   Sexual activity: Not on file  Other Topics Concern   Not on file  Social History Narrative   Not on file   Social Drivers of Health   Financial Resource Strain: Medium Risk (08/22/2023)   Overall Financial Resource Strain (CARDIA)    Difficulty of Paying Living Expenses: Somewhat hard  Food Insecurity: No Food Insecurity (08/22/2023)   Hunger Vital Sign    Worried About Running Out of Food in the Last Year: Never true    Ran Out of Food in the Last Year: Never true  Transportation Needs: No Transportation Needs (08/22/2023)   PRAPARE - Administrator, Civil Service (Medical): No    Lack of Transportation (Non-Medical): No  Physical Activity: Unknown (08/22/2023)   Exercise Vital Sign    Days of Exercise per Week: 0 days    Minutes of Exercise per Session: Not on file  Stress: No Stress Concern Present (08/22/2023)   [employer] of Occupational Health - Occupational Stress Questionnaire    Feeling of Stress : Only a little  Social Connections: Moderately Isolated (08/22/2023)   Social Connection and Isolation Panel [NHANES]    Frequency of Communication with Friends and Family: Once a week    Frequency of Social Gatherings with Friends and Family: Never    Attends Religious Services: More than 4 times per year    Active Member of [employer] or Organizations: Yes    Attends Banker Meetings: More than 4 times per year    Marital Status: Separated  Intimate Partner Violence: Unknown (12/25/2021)   Received from [employer], [facility]   HITS    Physically Hurt: Not on file    Insult or Talk Down To: Not on file    Threaten Physical Harm: Not on file    Scream or Curse: Not on file   Social History   Tobacco Use  Smoking Status Never  Smokeless Tobacco Never   Social History   Substance and Sexual  Activity  Alcohol Use Not Currently   Comment: occasional    No family history on file.   ROS: Denies fever, fatigue, unexplained weight loss/gain, hearing or vision changes, cardiac or respiratory complaints. Denies neurological deficits, musculoskeletal complaints, gastrointestinal or genitourinary complaints, mental health complaints, and skin changes.   Objective:   There were no vitals filed for this visit.  GENERAL APPEARANCE: Well-appearing, in NAD. Well nourished.  SKIN: Pink, warm and dry. Turgor normal. No rash, lesion, ulceration, or ecchymoses. Hair evenly distributed.  HEENT: HEAD: Normocephalic.  EYES: PERRLA. EOMI. Lids intact w/o defect. Sclera white,  Conjunctiva pink w/o exudate.  EARS: External ear w/o redness, swelling, masses or lesions. EAC clear. TM's intact, translucent w/o bulging, appropriate landmarks visualized. Appropriate acuity to conversational tones.  NOSE: Septum midline w/o deformity. Nares patent, mucosa pink and non-inflamed w/o drainage. No sinus tenderness.  THROAT: Uvula midline. Oropharynx clear. Tonsils non-inflamed w/o exudate ***. Oral mucosa pink and moist.  NECK: Supple, Trachea midline. Full ROM w/o pain or tenderness. No lymphadenopathy. Thyroid non-tender w/o enlargement or palpable masses.  BREASTS: Breasts pendulous, symmetrical, and w/o palpable masses. Nipples everted and w/o discharge. No rash or skin retraction. No axillary or supraclavicular lymphadenopathy.  RESPIRATORY: Chest wall symmetrical w/o masses. Respirations even and non-labored. Breath sounds clear to auscultation bilaterally. No wheezes, rales, rhonchi, or crackles. CARDIAC: S1, S2 present, regular rate and rhythm. No gallops, murmurs, rubs, or clicks. No carotid bruits. Capillary refill <2 seconds. Peripheral pulses 2+ bilaterally. GI: Abdomen soft w/o distention. Normoactive bowel sounds. No palpable masses or tenderness. No guarding or rebound tenderness. Liver and  spleen w/o tenderness or enlargement. No CVA tenderness.  MSK: Muscle tone and strength appropriate for age, w/o atrophy or abnormal movement.  EXTREMITIES: Active ROM intact, w/o tenderness, crepitus, or contracture. No obvious joint deformities or effusions. No clubbing, edema, or cyanosis.  NEUROLOGIC: CN's II-XII intact. Motor strength symmetrical with no obvious weakness. No sensory deficits. DTR's 2+ symmetric bilaterally. Steady, even gait.  PSYCH/MENTAL STATUS: Alert, oriented x 3. Cooperative, appropriate mood and affect.    Results for orders placed or performed in visit on 10/19/23  [facility] COLONOSCOPY   Collection Time: 11/29/14 12:00 AM  Result Value Ref Range   [facility] Colonoscopy See Report (in chart) See Report (in chart), Patient Reported    Assessment & Plan:  There are no diagnoses linked to this encounter.  No orders of the defined types were placed in this encounter.   PATIENT COUNSELING:  - Encouraged a healthy well-balanced diet. Patient may adjust caloric intake to maintain or achieve ideal body weight. May reduce intake of dietary saturated fat and total fat and have adequate dietary potassium and calcium preferably from fresh fruits, vegetables, and low-fat dairy products.   - Advised to avoid cigarette smoking. - Discussed with the patient that most people either abstain from alcohol or drink within safe limits (<=14/week and <=4 drinks/occasion for males, <=7/weeks and <= 3 drinks/occasion for females) and that the risk for alcohol disorders and other health effects rises proportionally with the number of drinks per week and how often a drinker exceeds daily limits. - Discussed cessation/primary prevention of drug use and availability of treatment for abuse.  - Discussed sexually transmitted diseases, avoidance of unintended pregnancy and contraceptive alternatives.  - Stressed the importance of regular exercise - Injury prevention: Discussed safety belts, safety  helmets, smoke detector, smoking near bedding or upholstery.  - Dental health: Discussed importance of regular tooth brushing, flossing, and dental visits.   NEXT PREVENTATIVE PHYSICAL DUE IN 1 YEAR.  No follow-ups on file.  Salvatore Decent, FNP

## 2023-12-09 ENCOUNTER — Ambulatory Visit (INDEPENDENT_AMBULATORY_CARE_PROVIDER_SITE_OTHER): Payer: BC Managed Care – PPO | Admitting: Internal Medicine

## 2023-12-09 VITALS — BP 124/84 | HR 74 | Temp 97.7°F | Ht 64.0 in | Wt 197.8 lb

## 2023-12-09 DIAGNOSIS — D490 Neoplasm of unspecified behavior of digestive system: Secondary | ICD-10-CM

## 2023-12-09 DIAGNOSIS — Z1159 Encounter for screening for other viral diseases: Secondary | ICD-10-CM

## 2023-12-09 DIAGNOSIS — Z23 Encounter for immunization: Secondary | ICD-10-CM | POA: Diagnosis not present

## 2023-12-09 DIAGNOSIS — Z114 Encounter for screening for human immunodeficiency virus [HIV]: Secondary | ICD-10-CM

## 2023-12-09 DIAGNOSIS — Z0000 Encounter for general adult medical examination without abnormal findings: Secondary | ICD-10-CM

## 2023-12-09 LAB — CBC WITH DIFFERENTIAL/PLATELET
Basophils Absolute: 0 10*3/uL (ref 0.0–0.1)
Basophils Relative: 0.7 % (ref 0.0–3.0)
Eosinophils Absolute: 0.1 10*3/uL (ref 0.0–0.7)
Eosinophils Relative: 1.6 % (ref 0.0–5.0)
HCT: 39.8 % (ref 36.0–46.0)
Hemoglobin: 13.2 g/dL (ref 12.0–15.0)
Lymphocytes Relative: 25.5 % (ref 12.0–46.0)
Lymphs Abs: 1.6 10*3/uL (ref 0.7–4.0)
MCHC: 33.3 g/dL (ref 30.0–36.0)
MCV: 87.8 fl (ref 78.0–100.0)
Monocytes Absolute: 0.3 10*3/uL (ref 0.1–1.0)
Monocytes Relative: 5.5 % (ref 3.0–12.0)
Neutro Abs: 4.1 10*3/uL (ref 1.4–7.7)
Neutrophils Relative %: 66.7 % (ref 43.0–77.0)
Platelets: 257 10*3/uL (ref 150.0–400.0)
RBC: 4.53 Mil/uL (ref 3.87–5.11)
RDW: 14.2 % (ref 11.5–15.5)
WBC: 6.1 10*3/uL (ref 4.0–10.5)

## 2023-12-09 LAB — COMPREHENSIVE METABOLIC PANEL
ALT: 14 U/L (ref 0–35)
AST: 18 U/L (ref 0–37)
Albumin: 4.4 g/dL (ref 3.5–5.2)
Alkaline Phosphatase: 141 U/L — ABNORMAL HIGH (ref 39–117)
BUN: 8 mg/dL (ref 6–23)
CO2: 29 meq/L (ref 19–32)
Calcium: 9.5 mg/dL (ref 8.4–10.5)
Chloride: 102 meq/L (ref 96–112)
Creatinine, Ser: 0.67 mg/dL (ref 0.40–1.20)
GFR: 101.11 mL/min (ref >60.00)
Glucose, Bld: 97 mg/dL (ref 70–99)
Potassium: 4.1 meq/L (ref 3.5–5.1)
Sodium: 139 meq/L (ref 135–145)
Total Bilirubin: 0.5 mg/dL (ref 0.2–1.2)
Total Protein: 7.7 g/dL (ref 6.0–8.3)

## 2023-12-09 LAB — LIPID PANEL
Cholesterol: 189 mg/dL (ref 0–200)
HDL: 63.9 mg/dL (ref >39.00)
LDL Cholesterol: 113 mg/dL — ABNORMAL HIGH (ref 0–99)
NonHDL: 125.25
Total CHOL/HDL Ratio: 3
Triglycerides: 63 mg/dL (ref 0.0–149.0)
VLDL: 12.6 mg/dL (ref 0.0–40.0)

## 2023-12-09 NOTE — Patient Instructions (Addendum)
 [facility]  [id number], [place], [place] [address] Phone: [phone]   Dr. Christell Constant, Dr. Jane Canary    Return in 3 months (nurse visit only) for 2nd shingles vaccine

## 2023-12-11 ENCOUNTER — Encounter: Payer: Self-pay | Admitting: Internal Medicine

## 2023-12-12 ENCOUNTER — Encounter: Payer: Self-pay | Admitting: Internal Medicine

## 2023-12-12 ENCOUNTER — Other Ambulatory Visit: Payer: Self-pay | Admitting: Internal Medicine

## 2023-12-12 DIAGNOSIS — I7300 Raynaud's syndrome without gangrene: Secondary | ICD-10-CM

## 2023-12-12 DIAGNOSIS — K219 Gastro-esophageal reflux disease without esophagitis: Secondary | ICD-10-CM

## 2023-12-12 DIAGNOSIS — Z23 Encounter for immunization: Secondary | ICD-10-CM

## 2023-12-12 DIAGNOSIS — G2581 Restless legs syndrome: Secondary | ICD-10-CM

## 2023-12-12 DIAGNOSIS — N281 Cyst of kidney, acquired: Secondary | ICD-10-CM

## 2023-12-12 DIAGNOSIS — E0590 Thyrotoxicosis, unspecified without thyrotoxic crisis or storm: Secondary | ICD-10-CM

## 2023-12-12 DIAGNOSIS — Z9889 Other specified postprocedural states: Secondary | ICD-10-CM

## 2023-12-16 ENCOUNTER — Ambulatory Visit
Admission: RE | Admit: 2023-12-16 | Discharge: 2023-12-16 | Disposition: A | Discharge status: Home / Self Care | Source: Ambulatory Visit | Attending: Internal Medicine | Admitting: Internal Medicine

## 2023-12-16 DIAGNOSIS — R921 Mammographic calcification found on diagnostic imaging of breast: Secondary | ICD-10-CM | POA: Diagnosis not present

## 2023-12-16 DIAGNOSIS — Z9889 Other specified postprocedural states: Secondary | ICD-10-CM

## 2023-12-16 DIAGNOSIS — Z853 Personal history of malignant neoplasm of breast: Secondary | ICD-10-CM | POA: Diagnosis not present

## 2023-12-18 ENCOUNTER — Ambulatory Visit
Admission: RE | Admit: 2023-12-18 | Discharge: 2023-12-18 | Disposition: A | Discharge status: Home / Self Care | Source: Ambulatory Visit | Attending: Internal Medicine | Admitting: Internal Medicine

## 2023-12-18 DIAGNOSIS — N2889 Other specified disorders of kidney and ureter: Secondary | ICD-10-CM

## 2023-12-18 DIAGNOSIS — N281 Cyst of kidney, acquired: Secondary | ICD-10-CM

## 2023-12-19 ENCOUNTER — Other Ambulatory Visit: Payer: Self-pay | Admitting: Internal Medicine

## 2023-12-19 ENCOUNTER — Encounter: Payer: Self-pay | Admitting: Internal Medicine

## 2023-12-19 DIAGNOSIS — R921 Mammographic calcification found on diagnostic imaging of breast: Secondary | ICD-10-CM

## 2023-12-20 ENCOUNTER — Other Ambulatory Visit

## 2023-12-22 ENCOUNTER — Other Ambulatory Visit

## 2023-12-22 LAB — T3, FREE: T3, Free: 3.4 pg/mL (ref 2.3–4.2)

## 2023-12-22 LAB — T4, FREE: Free T4: 1.4 ng/dL (ref 0.8–1.8)

## 2023-12-22 LAB — TSH: TSH: 0.11 m[IU]/L — ABNORMAL LOW

## 2023-12-24 ENCOUNTER — Other Ambulatory Visit: Payer: Self-pay | Admitting: Family

## 2023-12-26 ENCOUNTER — Encounter: Payer: Self-pay | Admitting: Internal Medicine

## 2023-12-27 ENCOUNTER — Ambulatory Visit
Admission: RE | Admit: 2023-12-27 | Discharge: 2023-12-27 | Disposition: A | Discharge status: Home / Self Care | Source: Ambulatory Visit | Attending: Internal Medicine | Admitting: Internal Medicine

## 2023-12-27 DIAGNOSIS — N6011 Diffuse cystic mastopathy of right breast: Secondary | ICD-10-CM | POA: Diagnosis not present

## 2023-12-27 DIAGNOSIS — R921 Mammographic calcification found on diagnostic imaging of breast: Secondary | ICD-10-CM

## 2023-12-27 DIAGNOSIS — R92321 Mammographic fibroglandular density, right breast: Secondary | ICD-10-CM | POA: Diagnosis not present

## 2023-12-27 HISTORY — PX: BREAST BIOPSY: SHX20

## 2023-12-28 ENCOUNTER — Encounter: Payer: Self-pay | Admitting: Internal Medicine

## 2023-12-28 ENCOUNTER — Other Ambulatory Visit (HOSPITAL_BASED_OUTPATIENT_CLINIC_OR_DEPARTMENT_OTHER): Payer: Self-pay

## 2023-12-28 ENCOUNTER — Ambulatory Visit: Admitting: "Endocrinology

## 2023-12-28 ENCOUNTER — Encounter: Payer: Self-pay | Admitting: "Endocrinology

## 2023-12-28 VITALS — BP 126/80 | HR 65 | Ht 64.0 in | Wt 199.0 lb

## 2023-12-28 DIAGNOSIS — E0500 Thyrotoxicosis with diffuse goiter without thyrotoxic crisis or storm: Secondary | ICD-10-CM | POA: Diagnosis not present

## 2023-12-28 LAB — SURGICAL PATHOLOGY

## 2023-12-28 MED ORDER — METHIMAZOLE 5 MG PO TABS
7.5000 mg | ORAL_TABLET | Freq: Every day | ORAL | 1 refills | Status: DC
  Filled 2023-12-28 – 2024-02-03 (×2): qty 135, 90d supply, fill #0
  Filled 2024-04-12: qty 135, 90d supply, fill #1

## 2023-12-28 NOTE — Patient Instructions (Signed)
  If you notice any symptoms of worsening fatigue, fever with sore throat, loss of appetite, yellowing of eyes, dark urine, joint pains, sores in the mouth, itchy rash, light colored stools or abdominal pain, please stop the medication and call us immediately as this can be a serious side effect of the medication.

## 2023-12-28 NOTE — Progress Notes (Signed)
 Outpatient Endocrinology Note [place], [place]  12/28/23   Carmen Gutierrez 1971-08-05 [id number]  Referring Provider: Salvatore Decent, FNP Primary Care Provider: Salvatore Decent, FNP Subjective  No chief complaint on file.   Assessment & Plan  Diagnoses and all orders for this visit:  Graves disease -     TSH -     T3, free -     T4, free -     methimazole (TAPAZOLE) 5 MG tablet; Take 1.5 tablets (7.5 mg total) by mouth daily.    Carmen Gutierrez is currently taking methimazole 5 mg every Started methimazole in 07/2024. Patient is currently biochemically euthyroid with normal FT4 and FT3, TSH low.  Discussed the etiology for hyperthyroidism. Educated on thyroid axis.  Recommend the following: Take methimazole 7.5 mg once a day. Repeat labs in 3 months or sooner if symptoms of hyper or hypothyroidism develop.  Educated on definitive options of treatment including RAI therapy and surgery. Patient does not want RAI at this time.  Counseled on: -complications of untreated hyperthyroidism including atrial fibrillation, heart failure and osteoporosis -side effects of Methimazole including but not limited to allergic reaction, rash, bone marrow suppression, liver dysfunction and teratogenic potential -compliance and follow up needs    If you notice any symptoms of worsening fatigue, fever with sore throat, loss of appetite, yellowing of eyes, dark urine, joint pains, sores in the mouth, itchy rash, light colored stools or abdominal pain, please stop the medication and call us immediately as this can be a serious side effect of the medication.  Has thyroid ultrasound in 2024 that showed mild thyromegaly without nodules No images available, reviewed report Pemberton's sign -ve Continue monitoring clinically Pt is not interested in thyroidectomy offered by ENT  I have reviewed current medications, nurse's notes, allergies, vital signs, past medical and surgical history, family  medical history, and social history for this encounter. Counseled patient on symptoms, examination findings, lab findings, imaging results, treatment decisions and monitoring and prognosis. The patient understood the recommendations and agrees with the treatment plan. All questions regarding treatment plan were fully answered.   Return in about 3 months (around 03/28/2024) for visit + labs before next visit.   Carmen Meriwether, MD  12/28/23   I have reviewed current medications, nurse's notes, allergies, vital signs, past medical and surgical history, family medical history, and social history for this encounter. Counseled patient on symptoms, examination findings, lab findings, imaging results, treatment decisions and monitoring and prognosis. The patient understood the recommendations and agrees with the treatment plan. All questions regarding treatment plan were fully answered.   History of Present Illness Carmen Gutierrez is a 53 y.o. year old female who presents to our clinic with Graves disease diagnosed in 01/2023.    Symptoms suggestive of HYPOTHYROIDISM:  fatigue No weight gain Yes cold intolerance  Yes, varies between hot and cold constipation  No  Symptoms suggestive of HYPERTHYROIDISM:  weight loss  No (lost 100 lbs in 2023 and gained 60 lbs in 2024) heat intolerance Yes, varies between hot and cold hyperdefecation  Yes palpitations  Yes  Compressive symptoms:  dysphagia  No dysphonia  No positional dyspnea (especially with simultaneous arms elevation)  No  Smokes  No On biotin  No Personal history of head/neck surgery/irradiation  No  Adverse Drug Effects from Methimazole (MMI): rash No fever No throat pain No arthritis No mouth ulcers No jaundice No loss of appetite No lymphadenopathy No  Grave's Ophthalmopathy Clinical Activity Score: 0/9  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   Physical Exam  BP 126/80   Pulse 65   Ht 5\' 4"  (1.626 m)    Wt 199 lb (90.3 kg)   SpO2 98%   BMI 34.16 kg/m  Constitutional: well developed, well nourished Head: normocephalic, atraumatic, no exophthalmos Eyes: sclera anicteric, no redness Neck: + thyromegaly, no thyroid tenderness; no nodules palpated Lungs: normal respiratory effort Neurology: alert and oriented, no fine hand tremor Skin: dry, no appreciable rashes Musculoskeletal: no appreciable defects Psychiatric: normal mood and affect  Allergies Allergies  Allergen Reactions   Gadolinium Derivatives Hives and Shortness Of Breath   Shellfish Allergy Anaphylaxis, Hives and Swelling   Dilaudid [Hydromorphone Hcl] Hives   Phenergan [Promethazine Hcl] Hives   Reglan [Metoclopramide] Other (See Comments)    Makes restless leg worse    Current Medications Patient's Medications  New Prescriptions   No medications on file  Previous Medications   OMEPRAZOLE (PRILOSEC) 40 MG CAPSULE    Take 40 mg by mouth daily.   ONDANSETRON (ZOFRAN ODT) 8 MG DISINTEGRATING TABLET    Take 1 tablet (8 mg total) by mouth every 8 (eight) hours as needed for nausea or vomiting.   PRAMIPEXOLE (MIRAPEX) 0.75 MG TABLET    Take 1 tablet (0.75 mg total) by mouth every evening. 1-2 daily  Modified Medications   Modified Medication Previous Medication   METHIMAZOLE (TAPAZOLE) 5 MG TABLET methimazole (TAPAZOLE) 5 MG tablet      Take 1.5 tablets (7.5 mg total) by mouth daily.    Take 1 tablet (5 mg total) by mouth daily.  Discontinued Medications   No medications on file    Past Medical History Past Medical History:  Diagnosis Date   Anxiety    Breast cancer (HCC) 2014   right breast   Cancer (HCC)    Depression    GERD (gastroesophageal reflux disease)    History of kidney stones    Migraines    Persistent atrial fibrillation (HCC) 04/15/2023   Personal history of radiation therapy 2014   R   Restless leg syndrome     Past Surgical History Past Surgical History:  Procedure Laterality Date    ABDOMINAL HYSTERECTOMY     BREAST BIOPSY     BREAST BIOPSY Right 12/27/2023   MM RT BREAST BX W LOC DEV 1ST LESION IMAGE BX SPEC STEREO GUIDE 12/27/2023 [facility]   BREAST LUMPECTOMY Right 2014   BREAST SURGERY     c-sections x2     CHOLECYSTECTOMY     EXTRACORPOREAL SHOCK WAVE LITHOTRIPSY Left 03/30/2018   Procedure: LEFT EXTRACORPOREAL SHOCK WAVE LITHOTRIPSY (ESWL);  Surgeon: Malen Gauze, MD;  Location: [facility];  Service: Urology;  Laterality: Left;   HEMORROIDECTOMY      Family History family history is not on file.  Social History Social History   Socioeconomic History   Marital status: Legally Separated    Spouse name: Not on file   Number of children: Not on file   Years of education: Not on file   Highest education level: Some college, no degree  Occupational History   Not on file  Tobacco Use   Smoking status: Never   Smokeless tobacco: Never  Vaping Use   Vaping status: Never Used  Substance and Sexual Activity   Alcohol use: Not Currently    Comment: occasional   Drug use: No   Sexual activity: Not on file  Other Topics Concern   Not  on file  Social History Narrative   Not on file   Social Drivers of Health   Financial Resource Strain: Medium Risk (12/09/2023)   Overall Financial Resource Strain (CARDIA)    Difficulty of Paying Living Expenses: Somewhat hard  Food Insecurity: Patient Declined (12/09/2023)   Hunger Vital Sign    Worried About Running Out of Food in the Last Year: Patient declined    Ran Out of Food in the Last Year: Patient declined  Transportation Needs: No Transportation Needs (12/09/2023)   PRAPARE - Administrator, Civil Service (Medical): No    Lack of Transportation (Non-Medical): No  Physical Activity: Unknown (12/09/2023)   Exercise Vital Sign    Days of Exercise per Week: 0 days    Minutes of Exercise per Session: Not on file  Stress: Stress Concern Present (12/09/2023)   [employer] of Occupational  Health - Occupational Stress Questionnaire    Feeling of Stress : To some extent  Social Connections: Moderately Isolated (12/09/2023)   Social Connection and Isolation Panel [NHANES]    Frequency of Communication with Friends and Family: Never    Frequency of Social Gatherings with Friends and Family: Never    Attends Religious Services: More than 4 times per year    Active Member of Clubs or Organizations: No    Attends Engineer, structural: More than 4 times per year    Marital Status: Separated  Intimate Partner Violence: Unknown (12/25/2021)   Received from [facility], [facility]   HITS    Physically Hurt: Not on file    Insult or Talk Down To: Not on file    Threaten Physical Harm: Not on file    Scream or Curse: Not on file    Laboratory Investigations Lab Results  Component Value Date   TSH 0.11 (L) 12/22/2023   TSH 0.01 (L) 08/10/2023   TSH 0.02 (L) 07/21/2023   FREET4 1.4 12/22/2023   FREET4 1.11 08/10/2023   FREET4 1.89 (H) 07/21/2023     Lab Results  Component Value Date   TSI 495 (H) 08/10/2023     No components found for: "TRAB"   Lab Results  Component Value Date   CHOL 189 12/09/2023   Lab Results  Component Value Date   HDL 63.90 12/09/2023   Lab Results  Component Value Date   LDLCALC 113 (H) 12/09/2023   Lab Results  Component Value Date   TRIG 63.0 12/09/2023   Lab Results  Component Value Date   CHOLHDL 3 12/09/2023   Lab Results  Component Value Date   CREATININE 0.67 12/09/2023   Lab Results  Component Value Date   GFR 101.11 12/09/2023      Component Value Date/Time   NA 139 12/09/2023 0927   K 4.1 12/09/2023 0927   CL 102 12/09/2023 0927   CO2 29 12/09/2023 0927   GLUCOSE 97 12/09/2023 0927   BUN 8 12/09/2023 0927   CREATININE 0.67 12/09/2023 0927   CALCIUM 9.5 12/09/2023 0927   PROT 7.7 12/09/2023 0927   ALBUMIN 4.4 12/09/2023 0927   AST 18 12/09/2023 0927   ALT 14 12/09/2023 0927   ALKPHOS 141 (H)  12/09/2023 0927   BILITOT 0.5 12/09/2023 0927   GFRNONAA >60 04/11/2023 1001   GFRAA >60 09/20/2018 1632      Latest Ref Rng & Units 12/09/2023    9:27 AM 05/31/2023    2:00 PM 04/11/2023   10:01 AM  BMP  Glucose  70 - 99 mg/dL 97  86  91   BUN 6 - 23 mg/dL 8  10  10    Creatinine 0.40 - 1.20 mg/dL 1.61  0.96  0.45   Sodium 135 - 145 mEq/L 139  140  139   Potassium 3.5 - 5.1 mEq/L 4.1  3.9  4.4   Chloride 96 - 112 mEq/L 102  103  106   CO2 19 - 32 mEq/L 29  31  25    Calcium 8.4 - 10.5 mg/dL 9.5  8.8  8.7        Component Value Date/Time   WBC 6.1 12/09/2023 0927   RBC 4.53 12/09/2023 0927   HGB 13.2 12/09/2023 0927   HCT 39.8 12/09/2023 0927   PLT 257.0 12/09/2023 0927   MCV 87.8 12/09/2023 0927   MCH 29.1 04/11/2023 1001   MCHC 33.3 12/09/2023 0927   RDW 14.2 12/09/2023 0927   LYMPHSABS 1.6 12/09/2023 0927   MONOABS 0.3 12/09/2023 0927   EOSABS 0.1 12/09/2023 0927   BASOSABS 0.0 12/09/2023 4098      Parts of this note may have been dictated using voice recognition software. There may be variances in spelling and vocabulary which are unintentional. Not all errors are proofread. Please notify the Thereasa Parkin if any discrepancies are noted or if the meaning of any statement is not clear.

## 2023-12-29 ENCOUNTER — Other Ambulatory Visit: Payer: Self-pay

## 2023-12-29 ENCOUNTER — Other Ambulatory Visit (HOSPITAL_BASED_OUTPATIENT_CLINIC_OR_DEPARTMENT_OTHER): Payer: Self-pay

## 2023-12-29 MED ORDER — PRAMIPEXOLE DIHYDROCHLORIDE 0.75 MG PO TABS
0.7500 mg | ORAL_TABLET | Freq: Every evening | ORAL | 0 refills | Status: DC
  Filled 2023-12-29: qty 180, 90d supply, fill #0

## 2023-12-30 ENCOUNTER — Other Ambulatory Visit (HOSPITAL_BASED_OUTPATIENT_CLINIC_OR_DEPARTMENT_OTHER): Payer: Self-pay

## 2024-01-02 ENCOUNTER — Encounter

## 2024-01-10 ENCOUNTER — Other Ambulatory Visit (HOSPITAL_BASED_OUTPATIENT_CLINIC_OR_DEPARTMENT_OTHER): Payer: Self-pay

## 2024-02-03 ENCOUNTER — Other Ambulatory Visit (HOSPITAL_BASED_OUTPATIENT_CLINIC_OR_DEPARTMENT_OTHER): Payer: Self-pay

## 2024-02-21 ENCOUNTER — Encounter: Payer: BC Managed Care – PPO | Admitting: Internal Medicine

## 2024-03-13 ENCOUNTER — Ambulatory Visit

## 2024-03-21 ENCOUNTER — Other Ambulatory Visit

## 2024-03-25 ENCOUNTER — Other Ambulatory Visit: Payer: Self-pay | Admitting: Family

## 2024-03-28 ENCOUNTER — Ambulatory Visit: Admitting: "Endocrinology

## 2024-04-07 ENCOUNTER — Other Ambulatory Visit (HOSPITAL_COMMUNITY): Payer: Self-pay

## 2024-04-09 ENCOUNTER — Encounter: Payer: Self-pay | Admitting: Internal Medicine

## 2024-04-09 DIAGNOSIS — K219 Gastro-esophageal reflux disease without esophagitis: Secondary | ICD-10-CM

## 2024-04-09 DIAGNOSIS — G2581 Restless legs syndrome: Secondary | ICD-10-CM

## 2024-04-11 ENCOUNTER — Other Ambulatory Visit (HOSPITAL_BASED_OUTPATIENT_CLINIC_OR_DEPARTMENT_OTHER): Payer: Self-pay

## 2024-04-11 ENCOUNTER — Other Ambulatory Visit: Payer: Self-pay

## 2024-04-11 MED ORDER — PRAMIPEXOLE DIHYDROCHLORIDE 0.75 MG PO TABS
0.7500 mg | ORAL_TABLET | Freq: Every evening | ORAL | 3 refills | Status: AC
Start: 2024-04-11 — End: ?
  Filled 2024-04-11 – 2024-05-31 (×2): qty 180, 90d supply, fill #0

## 2024-04-11 MED ORDER — OMEPRAZOLE 40 MG PO CPDR
40.0000 mg | DELAYED_RELEASE_CAPSULE | Freq: Every day | ORAL | 3 refills | Status: AC
  Filled 2024-04-11: qty 90, 90d supply, fill #0
  Filled 2024-08-09: qty 90, 90d supply, fill #1
  Filled 2024-10-26: qty 90, 90d supply, fill #2

## 2024-04-12 ENCOUNTER — Other Ambulatory Visit (HOSPITAL_BASED_OUTPATIENT_CLINIC_OR_DEPARTMENT_OTHER): Payer: Self-pay

## 2024-04-12 ENCOUNTER — Other Ambulatory Visit: Payer: Self-pay

## 2024-04-17 ENCOUNTER — Other Ambulatory Visit (HOSPITAL_BASED_OUTPATIENT_CLINIC_OR_DEPARTMENT_OTHER): Payer: Self-pay

## 2024-05-23 ENCOUNTER — Other Ambulatory Visit: Payer: Self-pay | Admitting: "Endocrinology

## 2024-05-23 DIAGNOSIS — E0500 Thyrotoxicosis with diffuse goiter without thyrotoxic crisis or storm: Secondary | ICD-10-CM

## 2024-05-30 ENCOUNTER — Encounter: Payer: Self-pay | Admitting: "Endocrinology

## 2024-05-30 DIAGNOSIS — E0500 Thyrotoxicosis with diffuse goiter without thyrotoxic crisis or storm: Secondary | ICD-10-CM

## 2024-05-31 ENCOUNTER — Other Ambulatory Visit (HOSPITAL_BASED_OUTPATIENT_CLINIC_OR_DEPARTMENT_OTHER): Payer: Self-pay

## 2024-06-01 ENCOUNTER — Other Ambulatory Visit (HOSPITAL_BASED_OUTPATIENT_CLINIC_OR_DEPARTMENT_OTHER): Payer: Self-pay

## 2024-06-01 MED ORDER — METHIMAZOLE 5 MG PO TABS
7.5000 mg | ORAL_TABLET | Freq: Every day | ORAL | 0 refills | Status: AC
Start: 2024-06-01 — End: ?
  Filled 2024-06-01 (×2): qty 135, 90d supply, fill #0

## 2024-06-03 ENCOUNTER — Encounter: Payer: Self-pay | Admitting: Internal Medicine

## 2024-06-04 MED ORDER — ONDANSETRON 8 MG PO TBDP: 8.0000 mg | ORAL_TABLET | Freq: Three times a day (TID) | ORAL | 1 refills | Status: DC | PRN

## 2024-06-04 NOTE — Telephone Encounter (Signed)
Please review and advise. Thanks. Dm/cma  

## 2024-06-08 ENCOUNTER — Other Ambulatory Visit

## 2024-06-11 ENCOUNTER — Ambulatory Visit: Admitting: Internal Medicine

## 2024-06-15 ENCOUNTER — Ambulatory Visit: Admitting: "Endocrinology

## 2024-06-30 ENCOUNTER — Other Ambulatory Visit (HOSPITAL_BASED_OUTPATIENT_CLINIC_OR_DEPARTMENT_OTHER): Payer: Self-pay | Admitting: Emergency Medicine

## 2024-06-30 ENCOUNTER — Other Ambulatory Visit (HOSPITAL_BASED_OUTPATIENT_CLINIC_OR_DEPARTMENT_OTHER): Payer: Self-pay

## 2024-07-02 ENCOUNTER — Other Ambulatory Visit (HOSPITAL_BASED_OUTPATIENT_CLINIC_OR_DEPARTMENT_OTHER): Payer: Self-pay

## 2024-07-02 NOTE — Telephone Encounter (Signed)
 Not my patient

## 2024-07-04 ENCOUNTER — Encounter (HOSPITAL_BASED_OUTPATIENT_CLINIC_OR_DEPARTMENT_OTHER): Payer: Self-pay

## 2024-07-04 ENCOUNTER — Other Ambulatory Visit (HOSPITAL_BASED_OUTPATIENT_CLINIC_OR_DEPARTMENT_OTHER): Payer: Self-pay

## 2024-07-04 MED ORDER — ONDANSETRON 8 MG PO TBDP
8.0000 mg | ORAL_TABLET | Freq: Three times a day (TID) | ORAL | 1 refills | Status: DC | PRN
  Filled 2024-07-04 – 2024-07-16 (×2): qty 12, 4d supply, fill #0
  Filled 2024-10-06: qty 12, 4d supply, fill #1

## 2024-07-04 NOTE — Addendum Note (Signed)
 Addended by: CARVIN SPELLER on: 07/04/2024 11:07 AM   Modules accepted: Orders

## 2024-07-04 NOTE — Telephone Encounter (Signed)
 Pharmacy states she need a a new prescription ondansetron  (ZOFRAN  ODT) 8 MG disintegrating tablet   LOV 12/09/23 FOV not scheduled  LRF 06/04/24

## 2024-07-07 ENCOUNTER — Other Ambulatory Visit (HOSPITAL_BASED_OUTPATIENT_CLINIC_OR_DEPARTMENT_OTHER): Payer: Self-pay

## 2024-07-10 ENCOUNTER — Other Ambulatory Visit (HOSPITAL_BASED_OUTPATIENT_CLINIC_OR_DEPARTMENT_OTHER): Payer: Self-pay

## 2024-07-11 ENCOUNTER — Other Ambulatory Visit (HOSPITAL_BASED_OUTPATIENT_CLINIC_OR_DEPARTMENT_OTHER): Payer: Self-pay

## 2024-07-16 ENCOUNTER — Other Ambulatory Visit (HOSPITAL_BASED_OUTPATIENT_CLINIC_OR_DEPARTMENT_OTHER): Payer: Self-pay

## 2024-08-09 ENCOUNTER — Other Ambulatory Visit: Payer: Self-pay

## 2024-08-09 ENCOUNTER — Other Ambulatory Visit (HOSPITAL_BASED_OUTPATIENT_CLINIC_OR_DEPARTMENT_OTHER): Payer: Self-pay

## 2024-08-11 ENCOUNTER — Other Ambulatory Visit (HOSPITAL_BASED_OUTPATIENT_CLINIC_OR_DEPARTMENT_OTHER): Payer: Self-pay

## 2024-08-23 ENCOUNTER — Other Ambulatory Visit

## 2024-08-28 ENCOUNTER — Ambulatory Visit: Admitting: "Endocrinology

## 2024-10-06 ENCOUNTER — Other Ambulatory Visit (HOSPITAL_BASED_OUTPATIENT_CLINIC_OR_DEPARTMENT_OTHER): Payer: Self-pay

## 2024-10-12 ENCOUNTER — Other Ambulatory Visit: Payer: Self-pay

## 2024-10-12 DIAGNOSIS — E049 Nontoxic goiter, unspecified: Secondary | ICD-10-CM

## 2024-10-12 DIAGNOSIS — E0500 Thyrotoxicosis with diffuse goiter without thyrotoxic crisis or storm: Secondary | ICD-10-CM

## 2024-10-12 DIAGNOSIS — E0590 Thyrotoxicosis, unspecified without thyrotoxic crisis or storm: Secondary | ICD-10-CM

## 2024-10-14 ENCOUNTER — Encounter: Payer: Self-pay | Admitting: Internal Medicine

## 2024-10-14 DIAGNOSIS — R42 Dizziness and giddiness: Secondary | ICD-10-CM

## 2024-10-15 ENCOUNTER — Other Ambulatory Visit (HOSPITAL_BASED_OUTPATIENT_CLINIC_OR_DEPARTMENT_OTHER): Payer: Self-pay

## 2024-10-15 MED ORDER — MECLIZINE HCL 25 MG PO TABS
25.0000 mg | ORAL_TABLET | Freq: Three times a day (TID) | ORAL | 0 refills | Status: AC | PRN
  Filled 2024-10-15: qty 30, 10d supply, fill #0

## 2024-10-23 ENCOUNTER — Other Ambulatory Visit

## 2024-10-24 LAB — TSH: TSH: 5.73 m[IU]/L — ABNORMAL HIGH

## 2024-10-24 LAB — T3, FREE: T3, Free: 2.9 pg/mL (ref 2.3–4.2)

## 2024-10-24 LAB — T4, FREE: Free T4: 1.1 ng/dL (ref 0.8–1.8)

## 2024-10-26 ENCOUNTER — Other Ambulatory Visit (HOSPITAL_BASED_OUTPATIENT_CLINIC_OR_DEPARTMENT_OTHER): Payer: Self-pay

## 2024-10-26 ENCOUNTER — Other Ambulatory Visit: Payer: Self-pay

## 2024-10-26 ENCOUNTER — Other Ambulatory Visit: Payer: Self-pay | Admitting: Internal Medicine

## 2024-10-26 MED ORDER — ONDANSETRON 8 MG PO TBDP
8.0000 mg | ORAL_TABLET | Freq: Three times a day (TID) | ORAL | 1 refills | Status: AC | PRN
  Filled 2024-10-26: qty 12, 4d supply, fill #0

## 2024-10-26 NOTE — Telephone Encounter (Signed)
 Pt requesting refill for ondansetron  (ZOFRAN -ODT) 8 MG disintegrating tablet   LOV 11/30/23 FOV 12/13/24 LRF  07/04/24

## 2024-10-30 ENCOUNTER — Ambulatory Visit: Admitting: "Endocrinology

## 2024-12-13 ENCOUNTER — Encounter: Admitting: Internal Medicine
